# Patient Record
Sex: Female | Born: 1950 | Race: White | Hispanic: No | Marital: Married | Smoking: Former smoker
Health system: Southern US, Community
[De-identification: ages and names within clinical notes are randomized; demographics above are authoritative.]

## PROBLEM LIST (undated history)

## (undated) DIAGNOSIS — R011 Cardiac murmur, unspecified: Secondary | ICD-10-CM

## (undated) DIAGNOSIS — E669 Obesity, unspecified: Secondary | ICD-10-CM

## (undated) DIAGNOSIS — K219 Gastro-esophageal reflux disease without esophagitis: Secondary | ICD-10-CM

## (undated) DIAGNOSIS — E785 Hyperlipidemia, unspecified: Secondary | ICD-10-CM

## (undated) DIAGNOSIS — D689 Coagulation defect, unspecified: Secondary | ICD-10-CM

## (undated) DIAGNOSIS — F419 Anxiety disorder, unspecified: Secondary | ICD-10-CM

## (undated) DIAGNOSIS — T7840XA Allergy, unspecified, initial encounter: Secondary | ICD-10-CM

## (undated) DIAGNOSIS — M1990 Unspecified osteoarthritis, unspecified site: Secondary | ICD-10-CM

## (undated) DIAGNOSIS — I10 Essential (primary) hypertension: Secondary | ICD-10-CM

## (undated) DIAGNOSIS — E871 Hypo-osmolality and hyponatremia: Secondary | ICD-10-CM

## (undated) DIAGNOSIS — R112 Nausea with vomiting, unspecified: Secondary | ICD-10-CM

## (undated) DIAGNOSIS — R7303 Prediabetes: Secondary | ICD-10-CM

## (undated) HISTORY — PX: COLONOSCOPY: SHX174

## (undated) HISTORY — DX: Obesity, unspecified: E66.9

## (undated) HISTORY — DX: Coagulation defect, unspecified: D68.9

## (undated) HISTORY — PX: CERVIX LESION DESTRUCTION: SHX591

## (undated) HISTORY — DX: Anxiety disorder, unspecified: F41.9

## (undated) HISTORY — DX: Gastro-esophageal reflux disease without esophagitis: K21.9

## (undated) HISTORY — DX: Allergy, unspecified, initial encounter: T78.40XA

## (undated) HISTORY — DX: Prediabetes: R73.03

## (undated) HISTORY — DX: Unspecified osteoarthritis, unspecified site: M19.90

## (undated) HISTORY — DX: Cardiac murmur, unspecified: R01.1

## (undated) HISTORY — DX: Hyperlipidemia, unspecified: E78.5

## (undated) HISTORY — DX: Essential (primary) hypertension: I10

---
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
## 1998-06-10 ENCOUNTER — Other Ambulatory Visit: Admission: RE | Admit: 1998-06-10 | Discharge: 1998-06-10 | Payer: Self-pay | Admitting: Obstetrics and Gynecology

## 2006-03-19 ENCOUNTER — Ambulatory Visit: Payer: Self-pay | Admitting: Internal Medicine

## 2006-04-02 ENCOUNTER — Encounter (INDEPENDENT_AMBULATORY_CARE_PROVIDER_SITE_OTHER): Payer: Self-pay | Admitting: *Deleted

## 2006-04-02 ENCOUNTER — Ambulatory Visit: Payer: Self-pay | Admitting: Internal Medicine

## 2009-09-13 ENCOUNTER — Ambulatory Visit (HOSPITAL_BASED_OUTPATIENT_CLINIC_OR_DEPARTMENT_OTHER): Admission: RE | Admit: 2009-09-13 | Discharge: 2009-09-13 | Payer: Self-pay | Admitting: Orthopedic Surgery

## 2009-11-13 HISTORY — PX: THUMB ARTHROSCOPY: SHX2509

## 2010-10-03 ENCOUNTER — Ambulatory Visit: Payer: Self-pay | Admitting: Cardiology

## 2011-01-19 ENCOUNTER — Encounter: Payer: Self-pay | Admitting: *Deleted

## 2011-01-19 ENCOUNTER — Other Ambulatory Visit: Payer: Self-pay | Admitting: *Deleted

## 2011-01-19 DIAGNOSIS — I10 Essential (primary) hypertension: Secondary | ICD-10-CM

## 2011-01-19 MED ORDER — EZETIMIBE 10 MG PO TABS: 10.0000 mg | ORAL_TABLET | Freq: Every day | ORAL | Status: DC

## 2011-01-19 MED ORDER — ATENOLOL 25 MG PO TABS: 25.0000 mg | ORAL_TABLET | Freq: Every day | ORAL | Status: DC

## 2011-03-23 LAB — BASIC METABOLIC PANEL
BUN: 16 mg/dL (ref 6–23)
GFR calc Af Amer: >60 mL/min (ref >60)
GFR calc non Af Amer: >60 mL/min (ref >60)
Potassium: 3.3 mEq/L — ABNORMAL LOW (ref 3.5–5.1)
Sodium: 139 mEq/L (ref 135–145)

## 2011-03-23 LAB — POCT HEMOGLOBIN-HEMACUE: Hemoglobin: 12.9 g/dL (ref 12.0–15.0)

## 2011-04-19 ENCOUNTER — Ambulatory Visit: Payer: Self-pay | Admitting: Cardiology

## 2011-04-23 ENCOUNTER — Encounter: Payer: Self-pay | Admitting: *Deleted

## 2011-04-23 ENCOUNTER — Ambulatory Visit (INDEPENDENT_AMBULATORY_CARE_PROVIDER_SITE_OTHER): Payer: Federal, State, Local not specified - PPO | Admitting: Cardiology

## 2011-04-23 ENCOUNTER — Encounter: Payer: Self-pay | Admitting: Cardiology

## 2011-04-23 VITALS — BP 144/82 | HR 62 | Ht 62.0 in | Wt 145.0 lb

## 2011-04-23 DIAGNOSIS — I10 Essential (primary) hypertension: Secondary | ICD-10-CM | POA: Insufficient documentation

## 2011-04-23 DIAGNOSIS — F419 Anxiety disorder, unspecified: Secondary | ICD-10-CM | POA: Insufficient documentation

## 2011-04-23 DIAGNOSIS — R9431 Abnormal electrocardiogram [ECG] [EKG]: Secondary | ICD-10-CM

## 2011-04-23 DIAGNOSIS — E785 Hyperlipidemia, unspecified: Secondary | ICD-10-CM | POA: Insufficient documentation

## 2011-04-23 DIAGNOSIS — R7303 Prediabetes: Secondary | ICD-10-CM | POA: Insufficient documentation

## 2011-04-23 DIAGNOSIS — E669 Obesity, unspecified: Secondary | ICD-10-CM | POA: Insufficient documentation

## 2011-04-23 NOTE — Assessment & Plan Note (Signed)
A positive family history and cardiovascular risk factors, we will arrange for her to have a stress Cardiolite study. I will have her see Dr. Felipa Eth for her followup medical care.

## 2011-04-23 NOTE — Assessment & Plan Note (Signed)
Blood pressure readings at home have been satisfactory

## 2011-04-23 NOTE — Assessment & Plan Note (Signed)
She's been doing a good job a gradually losing weight I encouraged her to continue exercise and dietary discipline.

## 2011-04-23 NOTE — Progress Notes (Signed)
Subjective:   Kimberly Huynh is seen back today for followup visit. Overall, she's been going reasonably well but she does have and then persistent abnormal EKG with abnormal T wave changes as well as a strong positive family history of heart disease. Her last stress Cardiolite study was in 2006 she's been exercising reasonably regularly but hasn't lost a great deal of weight. She has chronic problems of hypertension, obesity, hyperlipemia, and mild glucose intolerance. She sees Dr. Felipa Eth for followup.  Current Outpatient Prescriptions  Medication Sig Dispense Refill  . ALPRAZolam (XANAX) 0.5 MG tablet Take 0.25 mg by mouth at bedtime as needed.        . Ascorbic Acid (VITAMIN C) 500 MG tablet Take 500 mg by mouth daily.        Marland Kitchen aspirin 81 MG tablet Take 81 mg by mouth daily.        Marland Kitchen atenolol (TENORMIN) 25 MG tablet Take 1 tablet (25 mg total) by mouth daily.  30 tablet  11  . Calcium Carbonate-Vitamin D (CALCIUM + D PO) Take 2 tablets by mouth 2 (two) times daily.        . Choline Fenofibrate 135 MG capsule Take 135 mg by mouth daily.        . Cinnamon 500 MG capsule Take 1,000 mg by mouth 2 (two) times daily.        . ergocalciferol (VITAMIN D2) 50000 UNITS capsule Take 3,000 Units by mouth daily.        Marland Kitchen ezetimibe (ZETIA) 10 MG tablet Take 1 tablet (10 mg total) by mouth daily.  30 tablet  11  . levocetirizine (XYZAL) 5 MG tablet Take 5 mg by mouth every evening.        . multivitamin (THERAGRAN) per tablet Take 1 tablet by mouth daily.        Marland Kitchen omega-3 acid ethyl esters (LOVAZA) 1 G capsule Take 2 g by mouth daily.        . rosuvastatin (CRESTOR) 5 MG tablet Take 5 mg by mouth daily.        . Timolol Hemihydrate (BETIMOL OP) Apply 1 drop to eye daily. LEFT EYE      . valsartan-hydrochlorothiazide (DIOVAN-HCT) 80-12.5 MG per tablet Take 1 tablet by mouth daily.          No Known Allergies  Patient Active Problem List  Diagnoses  . Hypertension  . Hyperlipidemia  . Anxiety  . Obesity  .  Glucose intolerance (pre-diabetes)  . Abnormal EKG    History  Smoking status  . Former Smoker -- 0.8 packs/day for 10 years  . Types: Cigarettes  . Quit date: 12/18/1995  Smokeless tobacco  . Never Used    History  Alcohol Use  . Yes    Occ    Family History  Problem Relation Age of Onset  . Stroke Father   . Hypertension Brother   . Hypertension Brother   . Hypertension Brother   . Hypertension Sister   . Diabetes Mother   . Diabetes Brother     Review of Systems:   The patient denies any heat or cold intolerance.  No weight gain or weight loss.  The patient denies headaches or blurry vision.  There is no cough or sputum production.  The patient denies dizziness.  There is no hematuria or hematochezia.  The patient denies any muscle aches or arthritis.  The patient denies any rash.  The patient denies frequent falling or instability.  There is no  history of depression or anxiety.  All other systems were reviewed and are negative.   Physical Exam:   Weight is 145. Blood pressure 140/80 sitting, 140/82 standing, heart rate 62The head is normocephalic and atraumatic.  Pupils are equally round and reactive to light.  Sclerae nonicteric.  Conjunctiva is clear.  Oropharynx is unremarkable.  There's adequate oral airway.  Neck is supple there are no masses.  Thyroid is not enlarged.  There is no lymphadenopathy.  Lungs are clear.  Chest is symmetric.  Heart shows a regular rate and rhythm.  S1 and S2 are normal.  There is no murmur click or gallop.  Abdomen is soft normal bowel sounds.  There is no organomegaly.  Genital and rectal deferred.  Extremities are without edema.  Peripheral pulses are adequate.  Neurologically intact.  Full range of motion.  The patient is not depressed.  Skin is warm and dry. Assessment / Plan:

## 2011-05-02 ENCOUNTER — Ambulatory Visit (HOSPITAL_COMMUNITY): Payer: Federal, State, Local not specified - PPO | Attending: Cardiology | Admitting: Radiology

## 2011-05-02 VITALS — Ht 62.0 in | Wt 151.0 lb

## 2011-05-02 DIAGNOSIS — R9431 Abnormal electrocardiogram [ECG] [EKG]: Secondary | ICD-10-CM | POA: Insufficient documentation

## 2011-05-02 MED ORDER — TECHNETIUM TC 99M TETROFOSMIN IV KIT
33.0000 | PACK | Freq: Once | INTRAVENOUS | Status: AC | PRN
  Administered 2011-05-02: 33 via INTRAVENOUS

## 2011-05-02 MED ORDER — TECHNETIUM TC 99M TETROFOSMIN IV KIT
11.0000 | PACK | Freq: Once | INTRAVENOUS | Status: AC | PRN
  Administered 2011-05-02: 11 via INTRAVENOUS

## 2011-05-02 NOTE — Progress Notes (Signed)
[facility] 3 NUCLEAR MED [address] [place] [address] [phone]  Cardiology Nuclear Med Study  Kimberly Huynh is a 60 y.o. female [id number] [birth year]   Nuclear Med Background Indication for Stress Test:  Evaluation for Ischemia and Abnormal EKG History:  '06 OZH:YQMVHQ, EF=81% Cardiac Risk Factors:Strong Family History - CAD, History of Smoking, Hypertension and Lipids  Symptoms:  No cardiac complaints.   Nuclear Pre-Procedure Caffeine/Decaff Intake:  None NPO After: 10:30pm   Lungs:  Clear. IV 0.9% NS with Angio Cath:  20g  IV Site: R Wrist  IV Started by:  Stanton Kidney, EMT-P  Chest Size (in):  36 Cup Size: DD  Height: 5\' 2"  (1.575 m)  Weight:  151 lb (68.493 kg)  BMI:  Body mass index is 27.62 kg/(m^2). Tech Comments:  Atenolol held > 36 hours, per patient    Nuclear Med Study 1 or 2 day study: 1 day  Stress Test Type:  Stress  Reading MD: Arvilla Meres, MD  Order Authorizing Provider:  Roger Shelter, MD  Resting Radionuclide: Technetium 65m Tetrofosmin  Resting Radionuclide Dose: 11.0 mCi   Stress Radionuclide:  Technetium 10m Tetrofosmin  Stress Radionuclide Dose: 33.0 mCi           Stress Protocol Rest HR: 62 Stress HR: 144  Rest BP: 143/94 Stress BP: 194/91  Exercise Time (min): 8:00 METS: 10.0   Predicted Max HR: 158 bpm % Max HR: 91.14 bpm Rate Pressure Product: 46962   Dose of Adenosine (mg):  n/a Dose of Lexiscan: n/a mg  Dose of Atropine (mg): n/a Dose of Dobutamine: n/a mcg/kg/min (at max HR)  Stress Test Technologist: Smiley Houseman, CMA-N  Nuclear Technologist:  Domenic Polite, CNMT     Rest Procedure:  Myocardial perfusion imaging was performed at rest 45 minutes following the intravenous administration of Technetium 21m Tetrofosmin.  Rest ECG: Nonspecific T-wave changes.  Stress Procedure:  The patient exercised for eight minutes on the treadmill utilizing the Bruce protocol.  The patient stopped due to  fatigue and denied any chest pain.  There were marked ST-T wave changes, as noted on previous Cardiolite.  Technetium 69m Tetrofosmin was injected at peak exercise and myocardial perfusion imaging was performed after a brief delay.  Stress ECG: Significant ST abnormalities consistent with ischemia. and Positive. 1-2 mm inferior ST depression with exercise.   QPS Raw Data Images:  Normal; no motion artifact; normal heart/lung ratio. Stress Images:  Normal homogeneous uptake in all areas of the myocardium. Rest Images:  Normal homogeneous uptake in all areas of the myocardium. Subtraction (SDS):  Normal Transient Ischemic Dilatation (Normal <1.22):  0.84 Lung/Heart Ratio (Normal <0.45):  0.30  Quantitative Gated Spect Images QGS EDV:  43 ml QGS ESV:  6 ml QGS cine images:  NL LV Function; NL Wall Motion QGS EF: 87%  Impression Exercise Capacity:  Fair exercise capacity. BP Response:  Normal blood pressure response. Clinical Symptoms:  No chest pain. ECG Impression:  Positive. 1-2 mm inferior ST depression with exercise. Comparison with Prior Nuclear Study: No images to compare  Overall Impression:  Normal stress nuclear study. Exercise ECG likely false positive.    Kimberly Huynh

## 2011-05-03 ENCOUNTER — Other Ambulatory Visit: Payer: Self-pay | Admitting: *Deleted

## 2011-05-03 DIAGNOSIS — I10 Essential (primary) hypertension: Secondary | ICD-10-CM

## 2011-05-03 MED ORDER — VALSARTAN-HYDROCHLOROTHIAZIDE 80-12.5 MG PO TABS: 1.0000 | ORAL_TABLET | Freq: Every day | ORAL | Status: DC

## 2011-05-03 NOTE — Progress Notes (Signed)
Copy routed to Dr. Tennant.Falecha Clark ° °

## 2011-05-09 ENCOUNTER — Telehealth: Payer: Self-pay | Admitting: Cardiology

## 2011-05-09 NOTE — Telephone Encounter (Signed)
Results of stress test reviewed by Dr Deborah Chalk. Ok to report results are normal. Pt aware that she will be seen by Dietrich Pates at [facility] cardiology as needed. She also asked that we please fax results of stress test to Dr. Larina Earthly. [facility] primary doc

## 2011-05-09 NOTE — Telephone Encounter (Signed)
TESTING RESULTS-?STRESS TEST

## 2011-05-10 ENCOUNTER — Encounter: Payer: Self-pay | Admitting: Internal Medicine

## 2011-05-29 ENCOUNTER — Telehealth: Payer: Self-pay | Admitting: Cardiology

## 2011-05-29 NOTE — Telephone Encounter (Signed)
Pt requesting last nuclear testing, office visit, and labs to be faxed to Dr. Felipa Eth.  RN faxed requested records to Dr. Felipa Eth.  Pt knows to f/u with Dr. Dietrich Pates PRN.

## 2011-05-29 NOTE — Telephone Encounter (Signed)
Wants to talk to RN regarding who she will be seeing after Dr.Tennant leaves.

## 2011-06-27 ENCOUNTER — Ambulatory Visit (AMBULATORY_SURGERY_CENTER): Payer: Federal, State, Local not specified - PPO | Admitting: *Deleted

## 2011-06-27 VITALS — Ht 62.0 in | Wt 152.0 lb

## 2011-06-27 DIAGNOSIS — Z86010 Personal history of colonic polyps: Secondary | ICD-10-CM

## 2011-06-27 MED ORDER — PEG-KCL-NACL-NASULF-NA ASC-C 100 G PO SOLR: ORAL | Status: DC

## 2011-07-11 ENCOUNTER — Encounter: Payer: Self-pay | Admitting: Internal Medicine

## 2011-07-11 ENCOUNTER — Ambulatory Visit (AMBULATORY_SURGERY_CENTER): Payer: Federal, State, Local not specified - PPO | Admitting: Internal Medicine

## 2011-07-11 VITALS — BP 138/76 | HR 67 | Temp 98.5°F | Resp 14 | Ht 62.0 in | Wt 150.0 lb

## 2011-07-11 DIAGNOSIS — D126 Benign neoplasm of colon, unspecified: Secondary | ICD-10-CM

## 2011-07-11 DIAGNOSIS — Z1211 Encounter for screening for malignant neoplasm of colon: Secondary | ICD-10-CM

## 2011-07-11 DIAGNOSIS — Z86010 Personal history of colonic polyps: Secondary | ICD-10-CM

## 2011-07-11 MED ORDER — SODIUM CHLORIDE 0.9 % IV SOLN: 500.0000 mL | INTRAVENOUS | Status: DC

## 2011-07-11 NOTE — Patient Instructions (Signed)
Follow the discharge instructions.  Resume your medications.  Next Colonoscopy in 5 years.

## 2011-07-12 ENCOUNTER — Telehealth: Payer: Self-pay | Admitting: *Deleted

## 2011-07-12 NOTE — Telephone Encounter (Signed)
Left message on work voice mail that if pt had questions call us back. E McCraw RN

## 2011-08-10 ENCOUNTER — Other Ambulatory Visit: Payer: Self-pay | Admitting: *Deleted

## 2011-08-10 MED ORDER — CHOLINE FENOFIBRATE 135 MG PO CPDR: 135.0000 mg | DELAYED_RELEASE_CAPSULE | Freq: Every day | ORAL | Status: AC

## 2011-08-10 NOTE — Telephone Encounter (Signed)
escribe medication per fax request  

## 2012-02-29 ENCOUNTER — Other Ambulatory Visit: Payer: Self-pay

## 2012-02-29 DIAGNOSIS — I10 Essential (primary) hypertension: Secondary | ICD-10-CM

## 2012-02-29 MED ORDER — VALSARTAN-HYDROCHLOROTHIAZIDE 80-12.5 MG PO TABS: 1.0000 | ORAL_TABLET | Freq: Every day | ORAL | Status: DC

## 2012-12-24 ENCOUNTER — Other Ambulatory Visit: Payer: Self-pay | Admitting: *Deleted

## 2012-12-24 DIAGNOSIS — I10 Essential (primary) hypertension: Secondary | ICD-10-CM

## 2012-12-24 MED ORDER — VALSARTAN-HYDROCHLOROTHIAZIDE 80-12.5 MG PO TABS: 1.0000 | ORAL_TABLET | Freq: Every day | ORAL | Status: DC

## 2013-08-10 ENCOUNTER — Other Ambulatory Visit: Payer: Self-pay | Admitting: Dermatology

## 2014-05-03 DIAGNOSIS — E1169 Type 2 diabetes mellitus with other specified complication: Secondary | ICD-10-CM | POA: Diagnosis not present

## 2014-05-03 DIAGNOSIS — E782 Mixed hyperlipidemia: Secondary | ICD-10-CM | POA: Diagnosis not present

## 2014-05-04 DIAGNOSIS — I10 Essential (primary) hypertension: Secondary | ICD-10-CM | POA: Diagnosis not present

## 2014-05-04 DIAGNOSIS — E1169 Type 2 diabetes mellitus with other specified complication: Secondary | ICD-10-CM | POA: Diagnosis not present

## 2014-05-04 DIAGNOSIS — E782 Mixed hyperlipidemia: Secondary | ICD-10-CM | POA: Diagnosis not present

## 2014-05-04 DIAGNOSIS — Z6826 Body mass index (BMI) 26.0-26.9, adult: Secondary | ICD-10-CM | POA: Diagnosis not present

## 2014-05-04 DIAGNOSIS — J301 Allergic rhinitis due to pollen: Secondary | ICD-10-CM | POA: Diagnosis not present

## 2014-05-12 DIAGNOSIS — H409 Unspecified glaucoma: Secondary | ICD-10-CM | POA: Diagnosis not present

## 2014-05-12 DIAGNOSIS — H2589 Other age-related cataract: Secondary | ICD-10-CM | POA: Diagnosis not present

## 2014-05-12 DIAGNOSIS — H4011X0 Primary open-angle glaucoma, stage unspecified: Secondary | ICD-10-CM | POA: Diagnosis not present

## 2014-06-22 DIAGNOSIS — Z09 Encounter for follow-up examination after completed treatment for conditions other than malignant neoplasm: Secondary | ICD-10-CM | POA: Diagnosis not present

## 2014-08-17 DIAGNOSIS — L821 Other seborrheic keratosis: Secondary | ICD-10-CM | POA: Diagnosis not present

## 2014-08-17 DIAGNOSIS — L570 Actinic keratosis: Secondary | ICD-10-CM | POA: Diagnosis not present

## 2014-08-17 DIAGNOSIS — Z411 Encounter for cosmetic surgery: Secondary | ICD-10-CM | POA: Diagnosis not present

## 2014-08-17 DIAGNOSIS — D239 Other benign neoplasm of skin, unspecified: Secondary | ICD-10-CM | POA: Diagnosis not present

## 2014-09-07 DIAGNOSIS — Z124 Encounter for screening for malignant neoplasm of cervix: Secondary | ICD-10-CM | POA: Diagnosis not present

## 2014-09-07 DIAGNOSIS — Z01419 Encounter for gynecological examination (general) (routine) without abnormal findings: Secondary | ICD-10-CM | POA: Diagnosis not present

## 2014-09-21 ENCOUNTER — Encounter: Payer: Self-pay | Admitting: Internal Medicine

## 2014-09-22 DIAGNOSIS — E782 Mixed hyperlipidemia: Secondary | ICD-10-CM | POA: Diagnosis not present

## 2014-09-23 DIAGNOSIS — I10 Essential (primary) hypertension: Secondary | ICD-10-CM | POA: Diagnosis not present

## 2014-09-23 DIAGNOSIS — E782 Mixed hyperlipidemia: Secondary | ICD-10-CM | POA: Diagnosis not present

## 2014-09-23 DIAGNOSIS — Z23 Encounter for immunization: Secondary | ICD-10-CM | POA: Diagnosis not present

## 2014-09-23 DIAGNOSIS — T148 Other injury of unspecified body region: Secondary | ICD-10-CM | POA: Diagnosis not present

## 2014-09-23 DIAGNOSIS — E119 Type 2 diabetes mellitus without complications: Secondary | ICD-10-CM | POA: Diagnosis not present

## 2014-10-13 DIAGNOSIS — H4011X1 Primary open-angle glaucoma, mild stage: Secondary | ICD-10-CM | POA: Diagnosis not present

## 2014-12-15 DIAGNOSIS — Z1231 Encounter for screening mammogram for malignant neoplasm of breast: Secondary | ICD-10-CM | POA: Diagnosis not present

## 2014-12-15 DIAGNOSIS — Z803 Family history of malignant neoplasm of breast: Secondary | ICD-10-CM | POA: Diagnosis not present

## 2015-01-19 DIAGNOSIS — I10 Essential (primary) hypertension: Secondary | ICD-10-CM | POA: Diagnosis not present

## 2015-01-19 DIAGNOSIS — E782 Mixed hyperlipidemia: Secondary | ICD-10-CM | POA: Diagnosis not present

## 2015-01-19 DIAGNOSIS — E119 Type 2 diabetes mellitus without complications: Secondary | ICD-10-CM | POA: Diagnosis not present

## 2015-01-20 DIAGNOSIS — E119 Type 2 diabetes mellitus without complications: Secondary | ICD-10-CM | POA: Diagnosis not present

## 2015-01-20 DIAGNOSIS — Z6827 Body mass index (BMI) 27.0-27.9, adult: Secondary | ICD-10-CM | POA: Diagnosis not present

## 2015-01-20 DIAGNOSIS — E782 Mixed hyperlipidemia: Secondary | ICD-10-CM | POA: Diagnosis not present

## 2015-01-20 DIAGNOSIS — I10 Essential (primary) hypertension: Secondary | ICD-10-CM | POA: Diagnosis not present

## 2015-01-20 DIAGNOSIS — Z1389 Encounter for screening for other disorder: Secondary | ICD-10-CM | POA: Diagnosis not present

## 2015-01-20 DIAGNOSIS — M546 Pain in thoracic spine: Secondary | ICD-10-CM | POA: Diagnosis not present

## 2015-04-08 DIAGNOSIS — H4011X1 Primary open-angle glaucoma, mild stage: Secondary | ICD-10-CM | POA: Diagnosis not present

## 2015-04-25 DIAGNOSIS — H4011X1 Primary open-angle glaucoma, mild stage: Secondary | ICD-10-CM | POA: Diagnosis not present

## 2015-05-03 DIAGNOSIS — I10 Essential (primary) hypertension: Secondary | ICD-10-CM | POA: Diagnosis not present

## 2015-05-03 DIAGNOSIS — E559 Vitamin D deficiency, unspecified: Secondary | ICD-10-CM | POA: Diagnosis not present

## 2015-05-03 DIAGNOSIS — E782 Mixed hyperlipidemia: Secondary | ICD-10-CM | POA: Diagnosis not present

## 2015-05-03 DIAGNOSIS — E119 Type 2 diabetes mellitus without complications: Secondary | ICD-10-CM | POA: Diagnosis not present

## 2015-05-12 DIAGNOSIS — M810 Age-related osteoporosis without current pathological fracture: Secondary | ICD-10-CM | POA: Diagnosis not present

## 2015-05-12 DIAGNOSIS — Z6827 Body mass index (BMI) 27.0-27.9, adult: Secondary | ICD-10-CM | POA: Diagnosis not present

## 2015-05-12 DIAGNOSIS — J302 Other seasonal allergic rhinitis: Secondary | ICD-10-CM | POA: Diagnosis not present

## 2015-05-12 DIAGNOSIS — E119 Type 2 diabetes mellitus without complications: Secondary | ICD-10-CM | POA: Diagnosis not present

## 2015-05-12 DIAGNOSIS — E782 Mixed hyperlipidemia: Secondary | ICD-10-CM | POA: Diagnosis not present

## 2015-05-12 DIAGNOSIS — E559 Vitamin D deficiency, unspecified: Secondary | ICD-10-CM | POA: Diagnosis not present

## 2015-05-12 DIAGNOSIS — Z23 Encounter for immunization: Secondary | ICD-10-CM | POA: Diagnosis not present

## 2015-05-12 DIAGNOSIS — M546 Pain in thoracic spine: Secondary | ICD-10-CM | POA: Diagnosis not present

## 2015-05-12 DIAGNOSIS — I10 Essential (primary) hypertension: Secondary | ICD-10-CM | POA: Diagnosis not present

## 2015-05-12 DIAGNOSIS — G4700 Insomnia, unspecified: Secondary | ICD-10-CM | POA: Diagnosis not present

## 2015-05-12 DIAGNOSIS — Z0000 Encounter for general adult medical examination without abnormal findings: Secondary | ICD-10-CM | POA: Diagnosis not present

## 2015-05-17 DIAGNOSIS — Z1212 Encounter for screening for malignant neoplasm of rectum: Secondary | ICD-10-CM | POA: Diagnosis not present

## 2015-09-08 DIAGNOSIS — D225 Melanocytic nevi of trunk: Secondary | ICD-10-CM | POA: Diagnosis not present

## 2015-09-08 DIAGNOSIS — Z86018 Personal history of other benign neoplasm: Secondary | ICD-10-CM | POA: Diagnosis not present

## 2015-09-08 DIAGNOSIS — L821 Other seborrheic keratosis: Secondary | ICD-10-CM | POA: Diagnosis not present

## 2015-09-08 DIAGNOSIS — Z872 Personal history of diseases of the skin and subcutaneous tissue: Secondary | ICD-10-CM | POA: Diagnosis not present

## 2015-09-23 DIAGNOSIS — Z6827 Body mass index (BMI) 27.0-27.9, adult: Secondary | ICD-10-CM | POA: Diagnosis not present

## 2015-09-23 DIAGNOSIS — E782 Mixed hyperlipidemia: Secondary | ICD-10-CM | POA: Diagnosis not present

## 2015-09-23 DIAGNOSIS — E119 Type 2 diabetes mellitus without complications: Secondary | ICD-10-CM | POA: Diagnosis not present

## 2015-09-23 DIAGNOSIS — Z23 Encounter for immunization: Secondary | ICD-10-CM | POA: Diagnosis not present

## 2015-09-23 DIAGNOSIS — I10 Essential (primary) hypertension: Secondary | ICD-10-CM | POA: Diagnosis not present

## 2015-09-23 DIAGNOSIS — G4700 Insomnia, unspecified: Secondary | ICD-10-CM | POA: Diagnosis not present

## 2015-10-27 DIAGNOSIS — Z6827 Body mass index (BMI) 27.0-27.9, adult: Secondary | ICD-10-CM | POA: Diagnosis not present

## 2015-10-27 DIAGNOSIS — I10 Essential (primary) hypertension: Secondary | ICD-10-CM | POA: Diagnosis not present

## 2015-10-27 DIAGNOSIS — E119 Type 2 diabetes mellitus without complications: Secondary | ICD-10-CM | POA: Diagnosis not present

## 2015-10-27 DIAGNOSIS — R5383 Other fatigue: Secondary | ICD-10-CM | POA: Diagnosis not present

## 2015-10-27 DIAGNOSIS — E559 Vitamin D deficiency, unspecified: Secondary | ICD-10-CM | POA: Diagnosis not present

## 2015-10-27 DIAGNOSIS — F419 Anxiety disorder, unspecified: Secondary | ICD-10-CM | POA: Diagnosis not present

## 2015-11-28 DIAGNOSIS — H401131 Primary open-angle glaucoma, bilateral, mild stage: Secondary | ICD-10-CM | POA: Diagnosis not present

## 2015-12-02 DIAGNOSIS — Z124 Encounter for screening for malignant neoplasm of cervix: Secondary | ICD-10-CM | POA: Diagnosis not present

## 2015-12-02 DIAGNOSIS — N907 Vulvar cyst: Secondary | ICD-10-CM | POA: Diagnosis not present

## 2015-12-20 DIAGNOSIS — Z1231 Encounter for screening mammogram for malignant neoplasm of breast: Secondary | ICD-10-CM | POA: Diagnosis not present

## 2015-12-20 DIAGNOSIS — Z803 Family history of malignant neoplasm of breast: Secondary | ICD-10-CM | POA: Diagnosis not present

## 2015-12-30 DIAGNOSIS — Z1389 Encounter for screening for other disorder: Secondary | ICD-10-CM | POA: Diagnosis not present

## 2015-12-30 DIAGNOSIS — I10 Essential (primary) hypertension: Secondary | ICD-10-CM | POA: Diagnosis not present

## 2015-12-30 DIAGNOSIS — E559 Vitamin D deficiency, unspecified: Secondary | ICD-10-CM | POA: Diagnosis not present

## 2015-12-30 DIAGNOSIS — Z6827 Body mass index (BMI) 27.0-27.9, adult: Secondary | ICD-10-CM | POA: Diagnosis not present

## 2015-12-30 DIAGNOSIS — M810 Age-related osteoporosis without current pathological fracture: Secondary | ICD-10-CM | POA: Diagnosis not present

## 2015-12-30 DIAGNOSIS — E782 Mixed hyperlipidemia: Secondary | ICD-10-CM | POA: Diagnosis not present

## 2015-12-30 DIAGNOSIS — E119 Type 2 diabetes mellitus without complications: Secondary | ICD-10-CM | POA: Diagnosis not present

## 2016-01-05 DIAGNOSIS — H2513 Age-related nuclear cataract, bilateral: Secondary | ICD-10-CM | POA: Diagnosis not present

## 2016-01-05 DIAGNOSIS — H401131 Primary open-angle glaucoma, bilateral, mild stage: Secondary | ICD-10-CM | POA: Diagnosis not present

## 2016-02-13 ENCOUNTER — Other Ambulatory Visit: Payer: Self-pay | Admitting: Gastroenterology

## 2016-05-18 DIAGNOSIS — H401131 Primary open-angle glaucoma, bilateral, mild stage: Secondary | ICD-10-CM | POA: Diagnosis not present

## 2016-06-04 ENCOUNTER — Encounter: Payer: Self-pay | Admitting: Internal Medicine

## 2016-06-08 DIAGNOSIS — E782 Mixed hyperlipidemia: Secondary | ICD-10-CM | POA: Diagnosis not present

## 2016-06-08 DIAGNOSIS — R8299 Other abnormal findings in urine: Secondary | ICD-10-CM | POA: Diagnosis not present

## 2016-06-08 DIAGNOSIS — I10 Essential (primary) hypertension: Secondary | ICD-10-CM | POA: Diagnosis not present

## 2016-06-08 DIAGNOSIS — E559 Vitamin D deficiency, unspecified: Secondary | ICD-10-CM | POA: Diagnosis not present

## 2016-06-08 DIAGNOSIS — E119 Type 2 diabetes mellitus without complications: Secondary | ICD-10-CM | POA: Diagnosis not present

## 2016-06-08 DIAGNOSIS — N390 Urinary tract infection, site not specified: Secondary | ICD-10-CM | POA: Diagnosis not present

## 2016-06-15 DIAGNOSIS — J302 Other seasonal allergic rhinitis: Secondary | ICD-10-CM | POA: Diagnosis not present

## 2016-06-15 DIAGNOSIS — I10 Essential (primary) hypertension: Secondary | ICD-10-CM | POA: Diagnosis not present

## 2016-06-15 DIAGNOSIS — Z1389 Encounter for screening for other disorder: Secondary | ICD-10-CM | POA: Diagnosis not present

## 2016-06-15 DIAGNOSIS — E782 Mixed hyperlipidemia: Secondary | ICD-10-CM | POA: Diagnosis not present

## 2016-06-15 DIAGNOSIS — Z0000 Encounter for general adult medical examination without abnormal findings: Secondary | ICD-10-CM | POA: Diagnosis not present

## 2016-06-15 DIAGNOSIS — R011 Cardiac murmur, unspecified: Secondary | ICD-10-CM | POA: Diagnosis not present

## 2016-06-15 DIAGNOSIS — E119 Type 2 diabetes mellitus without complications: Secondary | ICD-10-CM | POA: Diagnosis not present

## 2016-06-15 DIAGNOSIS — F419 Anxiety disorder, unspecified: Secondary | ICD-10-CM | POA: Diagnosis not present

## 2016-06-15 DIAGNOSIS — Z6826 Body mass index (BMI) 26.0-26.9, adult: Secondary | ICD-10-CM | POA: Diagnosis not present

## 2016-06-15 DIAGNOSIS — M810 Age-related osteoporosis without current pathological fracture: Secondary | ICD-10-CM | POA: Diagnosis not present

## 2016-06-20 ENCOUNTER — Other Ambulatory Visit (HOSPITAL_COMMUNITY): Payer: Self-pay | Admitting: Internal Medicine

## 2016-06-20 DIAGNOSIS — R011 Cardiac murmur, unspecified: Secondary | ICD-10-CM

## 2016-06-27 DIAGNOSIS — Z872 Personal history of diseases of the skin and subcutaneous tissue: Secondary | ICD-10-CM | POA: Diagnosis not present

## 2016-06-27 DIAGNOSIS — D485 Neoplasm of uncertain behavior of skin: Secondary | ICD-10-CM | POA: Diagnosis not present

## 2016-06-27 DIAGNOSIS — L570 Actinic keratosis: Secondary | ICD-10-CM | POA: Diagnosis not present

## 2016-06-27 DIAGNOSIS — D225 Melanocytic nevi of trunk: Secondary | ICD-10-CM | POA: Diagnosis not present

## 2016-06-27 DIAGNOSIS — Z86018 Personal history of other benign neoplasm: Secondary | ICD-10-CM | POA: Diagnosis not present

## 2016-06-27 DIAGNOSIS — L821 Other seborrheic keratosis: Secondary | ICD-10-CM | POA: Diagnosis not present

## 2016-07-02 DIAGNOSIS — H401131 Primary open-angle glaucoma, bilateral, mild stage: Secondary | ICD-10-CM | POA: Diagnosis not present

## 2016-07-05 ENCOUNTER — Other Ambulatory Visit (HOSPITAL_COMMUNITY): Payer: Federal, State, Local not specified - PPO

## 2016-07-17 ENCOUNTER — Ambulatory Visit (HOSPITAL_COMMUNITY): Payer: Medicare Other | Attending: Cardiovascular Disease

## 2016-07-17 ENCOUNTER — Other Ambulatory Visit (HOSPITAL_COMMUNITY): Payer: Self-pay

## 2016-07-17 DIAGNOSIS — I119 Hypertensive heart disease without heart failure: Secondary | ICD-10-CM | POA: Diagnosis not present

## 2016-07-17 DIAGNOSIS — I071 Rheumatic tricuspid insufficiency: Secondary | ICD-10-CM | POA: Diagnosis not present

## 2016-07-17 DIAGNOSIS — Z87891 Personal history of nicotine dependence: Secondary | ICD-10-CM | POA: Insufficient documentation

## 2016-07-17 DIAGNOSIS — E785 Hyperlipidemia, unspecified: Secondary | ICD-10-CM | POA: Insufficient documentation

## 2016-07-17 DIAGNOSIS — R9431 Abnormal electrocardiogram [ECG] [EKG]: Secondary | ICD-10-CM | POA: Diagnosis not present

## 2016-07-17 DIAGNOSIS — R011 Cardiac murmur, unspecified: Secondary | ICD-10-CM | POA: Insufficient documentation

## 2016-07-17 DIAGNOSIS — I340 Nonrheumatic mitral (valve) insufficiency: Secondary | ICD-10-CM | POA: Diagnosis not present

## 2016-07-18 ENCOUNTER — Encounter: Payer: Self-pay | Admitting: Internal Medicine

## 2016-07-20 ENCOUNTER — Encounter: Payer: Self-pay | Admitting: Internal Medicine

## 2016-09-13 ENCOUNTER — Ambulatory Visit (AMBULATORY_SURGERY_CENTER): Payer: Self-pay | Admitting: *Deleted

## 2016-09-13 VITALS — Ht 62.0 in | Wt 143.4 lb

## 2016-09-13 DIAGNOSIS — Z86010 Personal history of colonic polyps: Secondary | ICD-10-CM

## 2016-09-13 MED ORDER — NA SULFATE-K SULFATE-MG SULF 17.5-3.13-1.6 GM/177ML PO SOLN: ORAL | 0 refills | Status: DC

## 2016-09-13 NOTE — Progress Notes (Signed)
No home oxygen used or diet medications taken  No egg or soy allergy  No anesthesia or intubation problems

## 2016-09-27 ENCOUNTER — Encounter: Payer: Medicare Other | Admitting: Internal Medicine

## 2016-10-12 ENCOUNTER — Encounter: Payer: Self-pay | Admitting: Internal Medicine

## 2016-10-12 ENCOUNTER — Ambulatory Visit (AMBULATORY_SURGERY_CENTER): Payer: Medicare Other | Admitting: Internal Medicine

## 2016-10-12 VITALS — BP 123/71 | HR 56 | Temp 97.1°F | Resp 11 | Ht 62.0 in | Wt 143.0 lb

## 2016-10-12 DIAGNOSIS — Z1211 Encounter for screening for malignant neoplasm of colon: Secondary | ICD-10-CM | POA: Diagnosis not present

## 2016-10-12 DIAGNOSIS — Z86010 Personal history of colonic polyps: Secondary | ICD-10-CM | POA: Diagnosis not present

## 2016-10-12 LAB — GLUCOSE, CAPILLARY
GLUCOSE-CAPILLARY: 160 mg/dL — AB (ref 65–99)
GLUCOSE-CAPILLARY: 141 mg/dL — AB (ref 65–99)

## 2016-10-12 MED ORDER — SODIUM CHLORIDE 0.9 % IV SOLN: 500.0000 mL | INTRAVENOUS | Status: DC

## 2016-10-12 NOTE — Patient Instructions (Addendum)
Impression/Recommendations:  Repeat colonoscopy in 10 years for surveillance.  YOU HAD AN ENDOSCOPIC PROCEDURE TODAY AT [facility]:   Refer to the procedure report that was given to you for any specific questions about what was found during the examination.  If the procedure report does not answer your questions, please call your gastroenterologist to clarify.  If you requested that your care partner not be given the details of your procedure findings, then the procedure report has been included in a sealed envelope for you to review at your convenience later.  YOU SHOULD EXPECT: Some feelings of bloating in the abdomen. Passage of more gas than usual.  Walking can help get rid of the air that was put into your GI tract during the procedure and reduce the bloating. If you had a lower endoscopy (such as a colonoscopy or flexible sigmoidoscopy) you may notice spotting of blood in your stool or on the toilet paper. If you underwent a bowel prep for your procedure, you may not have a normal bowel movement for a few days.  Please Note:  You might notice some irritation and congestion in your nose or some drainage.  This is from the oxygen used during your procedure.  There is no need for concern and it should clear up in a day or so.  SYMPTOMS TO REPORT IMMEDIATELY:   Following lower endoscopy (colonoscopy or flexible sigmoidoscopy):  Excessive amounts of blood in the stool  Significant tenderness or worsening of abdominal pains  Swelling of the abdomen that is new, acute  Fever of 100F or higher    For urgent or emergent issues, a gastroenterologist can be reached at any hour by calling [phone].   DIET:  We do recommend a small meal at first, but then you may proceed to your regular diet.  Drink plenty of fluids but you should avoid alcoholic beverages for 24 hours.  ACTIVITY:  You should plan to take it easy for the rest of today and you should NOT DRIVE or use heavy  machinery until tomorrow (because of the sedation medicines used during the test).    FOLLOW UP: Our staff will call the number listed on your records the next business day following your procedure to check on you and address any questions or concerns that you may have regarding the information given to you following your procedure. If we do not reach you, we will leave a message.  However, if you are feeling well and you are not experiencing any problems, there is no need to return our call.  We will assume that you have returned to your regular daily activities without incident.  If any biopsies were taken you will be contacted by phone or by letter within the next 1-3 weeks.  Please call us at [phone] if you have not heard about the biopsies in 3 weeks.    SIGNATURES/CONFIDENTIALITY: You and/or your care partner have signed paperwork which will be entered into your electronic medical record.  These signatures attest to the fact that that the information above on your After Visit Summary has been reviewed and is understood.  Full responsibility of the confidentiality of this discharge information lies with you and/or your care-partner.

## 2016-10-12 NOTE — Op Note (Signed)
[place] Patient Name: Anniemae Lauff Procedure Date: 10/12/2016 8:00 AM MRN: [id number] Endoscopist: Docia Chuck. Henrene Pastor , MD Age: 65 Referring MD:  Date of Birth: 02-25-1951 Gender: Female Account #: [id number] Procedure:                Colonoscopy Indications:              Surveillance: Personal history of adenomatous                            polyps on last colonoscopy 5 years ago, High risk                            colon cancer surveillance: Personal history of                            non-advanced adenoma. Prior examinations 2007 and                            2012 with single diminutive adenoma Medicines:                Monitored Anesthesia Care Procedure:                Pre-Anesthesia Assessment:                           - Prior to the procedure, a History and Physical                            was performed, and patient medications and                            allergies were reviewed. The patient's tolerance of                            previous anesthesia was also reviewed. The risks                            and benefits of the procedure and the sedation                            options and risks were discussed with the patient.                            All questions were answered, and informed consent                            was obtained. Prior Anticoagulants: The patient has                            taken no previous anticoagulant or antiplatelet                            agents. ASA Grade Assessment: II - A patient with  mild systemic disease. After reviewing the risks                            and benefits, the patient was deemed in                            satisfactory condition to undergo the procedure.                           After obtaining informed consent, the colonoscope                            was passed under direct vision. Throughout the                            procedure, the patient's blood  pressure, pulse, and                            oxygen saturations were monitored continuously. The                            Model CF-HQ190L [id number]) scope was introduced                            through the anus and advanced to the the cecum,                            identified by appendiceal orifice and ileocecal                            valve. The ileocecal valve, appendiceal orifice,                            and rectum were photographed. The quality of the                            bowel preparation was excellent. The colonoscopy                            was performed without difficulty. The patient                            tolerated the procedure well. The bowel preparation                            used was SUPREP. Scope In: 8:03:41 AM Scope Out: 8:13:17 AM Scope Withdrawal Time: 0 hours 7 minutes 35 seconds  Total Procedure Duration: 0 hours 9 minutes 36 seconds  Findings:                 The entire examined colon appeared normal on direct                            and retroflexion views. Complications:  No immediate complications. Estimated blood loss:                            None. Estimated Blood Loss:     Estimated blood loss: none. Impression:               - The entire examined colon is normal on direct and                            retroflexion views.                           - No specimens collected. Recommendation:           - Repeat colonoscopy in 10 years for surveillance.                           - Patient has a contact number available for                            emergencies. The signs and symptoms of potential                            delayed complications were discussed with the                            patient. Return to normal activities tomorrow.                            Written discharge instructions were provided to the                            patient.                           - Resume previous diet.                            - Continue present medications. Docia Chuck. Henrene Pastor, MD 10/12/2016 8:20:25 AM This report has been signed electronically.

## 2016-10-12 NOTE — Progress Notes (Signed)
A and O x3. Report to RN. Tolerated MAC anesthesia well. 

## 2016-10-15 ENCOUNTER — Telehealth: Payer: Self-pay

## 2016-10-15 NOTE — Telephone Encounter (Signed)
  Follow up Call-  Call back number 10/12/2016  Post procedure Call Back phone  # [phone]  Permission to leave phone message Yes  Some recent data might be hidden     Patient questions:  Do you have a fever, pain , or abdominal swelling? No. Pain Score  0 *  Have you tolerated food without any problems? Yes.    Have you been able to return to your normal activities? Yes.    Do you have any questions about your discharge instructions: Diet   No. Medications  No. Follow up visit  No.  Do you have questions or concerns about your Care? No.  Actions: * If pain score is 4 or above: No action needed, pain <4.

## 2016-10-22 DIAGNOSIS — E119 Type 2 diabetes mellitus without complications: Secondary | ICD-10-CM | POA: Diagnosis not present

## 2016-10-22 DIAGNOSIS — Z6826 Body mass index (BMI) 26.0-26.9, adult: Secondary | ICD-10-CM | POA: Diagnosis not present

## 2016-10-22 DIAGNOSIS — E782 Mixed hyperlipidemia: Secondary | ICD-10-CM | POA: Diagnosis not present

## 2016-10-22 DIAGNOSIS — I10 Essential (primary) hypertension: Secondary | ICD-10-CM | POA: Diagnosis not present

## 2016-10-22 DIAGNOSIS — Z23 Encounter for immunization: Secondary | ICD-10-CM | POA: Diagnosis not present

## 2016-10-22 DIAGNOSIS — D126 Benign neoplasm of colon, unspecified: Secondary | ICD-10-CM | POA: Diagnosis not present

## 2016-10-22 DIAGNOSIS — I5030 Unspecified diastolic (congestive) heart failure: Secondary | ICD-10-CM | POA: Diagnosis not present

## 2016-12-21 DIAGNOSIS — Z803 Family history of malignant neoplasm of breast: Secondary | ICD-10-CM | POA: Diagnosis not present

## 2016-12-21 DIAGNOSIS — Z1231 Encounter for screening mammogram for malignant neoplasm of breast: Secondary | ICD-10-CM | POA: Diagnosis not present

## 2016-12-26 DIAGNOSIS — Z124 Encounter for screening for malignant neoplasm of cervix: Secondary | ICD-10-CM | POA: Diagnosis not present

## 2016-12-26 DIAGNOSIS — Z01419 Encounter for gynecological examination (general) (routine) without abnormal findings: Secondary | ICD-10-CM | POA: Diagnosis not present

## 2017-01-01 DIAGNOSIS — L821 Other seborrheic keratosis: Secondary | ICD-10-CM | POA: Diagnosis not present

## 2017-01-01 DIAGNOSIS — L820 Inflamed seborrheic keratosis: Secondary | ICD-10-CM | POA: Diagnosis not present

## 2017-01-08 DIAGNOSIS — E119 Type 2 diabetes mellitus without complications: Secondary | ICD-10-CM | POA: Diagnosis not present

## 2017-01-08 DIAGNOSIS — H401131 Primary open-angle glaucoma, bilateral, mild stage: Secondary | ICD-10-CM | POA: Diagnosis not present

## 2017-02-18 DIAGNOSIS — H401131 Primary open-angle glaucoma, bilateral, mild stage: Secondary | ICD-10-CM | POA: Diagnosis not present

## 2017-03-13 DIAGNOSIS — Z6826 Body mass index (BMI) 26.0-26.9, adult: Secondary | ICD-10-CM | POA: Diagnosis not present

## 2017-03-13 DIAGNOSIS — Z1389 Encounter for screening for other disorder: Secondary | ICD-10-CM | POA: Diagnosis not present

## 2017-03-13 DIAGNOSIS — E119 Type 2 diabetes mellitus without complications: Secondary | ICD-10-CM | POA: Diagnosis not present

## 2017-03-13 DIAGNOSIS — I10 Essential (primary) hypertension: Secondary | ICD-10-CM | POA: Diagnosis not present

## 2017-03-13 DIAGNOSIS — E782 Mixed hyperlipidemia: Secondary | ICD-10-CM | POA: Diagnosis not present

## 2017-03-13 DIAGNOSIS — I5030 Unspecified diastolic (congestive) heart failure: Secondary | ICD-10-CM | POA: Diagnosis not present

## 2017-04-15 DIAGNOSIS — J209 Acute bronchitis, unspecified: Secondary | ICD-10-CM | POA: Diagnosis not present

## 2017-04-15 DIAGNOSIS — Z6826 Body mass index (BMI) 26.0-26.9, adult: Secondary | ICD-10-CM | POA: Diagnosis not present

## 2017-04-15 DIAGNOSIS — R05 Cough: Secondary | ICD-10-CM | POA: Diagnosis not present

## 2017-06-21 DIAGNOSIS — R8299 Other abnormal findings in urine: Secondary | ICD-10-CM | POA: Diagnosis not present

## 2017-06-21 DIAGNOSIS — E119 Type 2 diabetes mellitus without complications: Secondary | ICD-10-CM | POA: Diagnosis not present

## 2017-06-21 DIAGNOSIS — E784 Other hyperlipidemia: Secondary | ICD-10-CM | POA: Diagnosis not present

## 2017-06-21 DIAGNOSIS — N390 Urinary tract infection, site not specified: Secondary | ICD-10-CM | POA: Diagnosis not present

## 2017-06-21 DIAGNOSIS — M810 Age-related osteoporosis without current pathological fracture: Secondary | ICD-10-CM | POA: Diagnosis not present

## 2017-06-21 DIAGNOSIS — I10 Essential (primary) hypertension: Secondary | ICD-10-CM | POA: Diagnosis not present

## 2017-06-27 DIAGNOSIS — F419 Anxiety disorder, unspecified: Secondary | ICD-10-CM | POA: Diagnosis not present

## 2017-06-27 DIAGNOSIS — M810 Age-related osteoporosis without current pathological fracture: Secondary | ICD-10-CM | POA: Diagnosis not present

## 2017-06-27 DIAGNOSIS — Z6826 Body mass index (BMI) 26.0-26.9, adult: Secondary | ICD-10-CM | POA: Diagnosis not present

## 2017-06-27 DIAGNOSIS — Z0000 Encounter for general adult medical examination without abnormal findings: Secondary | ICD-10-CM | POA: Diagnosis not present

## 2017-06-27 DIAGNOSIS — D126 Benign neoplasm of colon, unspecified: Secondary | ICD-10-CM | POA: Diagnosis not present

## 2017-06-27 DIAGNOSIS — E782 Mixed hyperlipidemia: Secondary | ICD-10-CM | POA: Diagnosis not present

## 2017-06-27 DIAGNOSIS — Z1389 Encounter for screening for other disorder: Secondary | ICD-10-CM | POA: Diagnosis not present

## 2017-06-27 DIAGNOSIS — I10 Essential (primary) hypertension: Secondary | ICD-10-CM | POA: Diagnosis not present

## 2017-06-27 DIAGNOSIS — Z23 Encounter for immunization: Secondary | ICD-10-CM | POA: Diagnosis not present

## 2017-06-27 DIAGNOSIS — G4700 Insomnia, unspecified: Secondary | ICD-10-CM | POA: Diagnosis not present

## 2017-06-27 DIAGNOSIS — I5030 Unspecified diastolic (congestive) heart failure: Secondary | ICD-10-CM | POA: Diagnosis not present

## 2017-06-27 DIAGNOSIS — E119 Type 2 diabetes mellitus without complications: Secondary | ICD-10-CM | POA: Diagnosis not present

## 2017-07-17 DIAGNOSIS — E871 Hypo-osmolality and hyponatremia: Secondary | ICD-10-CM

## 2017-07-17 DIAGNOSIS — R112 Nausea with vomiting, unspecified: Secondary | ICD-10-CM

## 2017-07-17 HISTORY — DX: Nausea with vomiting, unspecified: R11.2

## 2017-07-17 HISTORY — DX: Hypo-osmolality and hyponatremia: E87.1

## 2017-07-25 DIAGNOSIS — H401131 Primary open-angle glaucoma, bilateral, mild stage: Secondary | ICD-10-CM | POA: Diagnosis not present

## 2017-07-29 ENCOUNTER — Encounter (HOSPITAL_COMMUNITY): Payer: Self-pay | Admitting: Emergency Medicine

## 2017-07-29 ENCOUNTER — Emergency Department (HOSPITAL_COMMUNITY)
Admission: EM | Admit: 2017-07-29 | Discharge: 2017-07-29 | Disposition: A | Discharge status: Home / Self Care | Payer: Medicare Other | Source: Home / Self Care | Attending: Emergency Medicine | Admitting: Emergency Medicine

## 2017-07-29 DIAGNOSIS — Z7982 Long term (current) use of aspirin: Secondary | ICD-10-CM | POA: Insufficient documentation

## 2017-07-29 DIAGNOSIS — I10 Essential (primary) hypertension: Secondary | ICD-10-CM

## 2017-07-29 DIAGNOSIS — Z87891 Personal history of nicotine dependence: Secondary | ICD-10-CM

## 2017-07-29 DIAGNOSIS — R112 Nausea with vomiting, unspecified: Secondary | ICD-10-CM | POA: Insufficient documentation

## 2017-07-29 DIAGNOSIS — R197 Diarrhea, unspecified: Principal | ICD-10-CM

## 2017-07-29 DIAGNOSIS — N179 Acute kidney failure, unspecified: Secondary | ICD-10-CM | POA: Diagnosis not present

## 2017-07-29 DIAGNOSIS — Z79899 Other long term (current) drug therapy: Secondary | ICD-10-CM

## 2017-07-29 DIAGNOSIS — E871 Hypo-osmolality and hyponatremia: Secondary | ICD-10-CM | POA: Diagnosis not present

## 2017-07-29 DIAGNOSIS — A084 Viral intestinal infection, unspecified: Secondary | ICD-10-CM | POA: Diagnosis not present

## 2017-07-29 DIAGNOSIS — E119 Type 2 diabetes mellitus without complications: Secondary | ICD-10-CM

## 2017-07-29 DIAGNOSIS — D649 Anemia, unspecified: Secondary | ICD-10-CM | POA: Diagnosis not present

## 2017-07-29 DIAGNOSIS — J690 Pneumonitis due to inhalation of food and vomit: Secondary | ICD-10-CM | POA: Diagnosis not present

## 2017-07-29 DIAGNOSIS — E876 Hypokalemia: Secondary | ICD-10-CM | POA: Diagnosis not present

## 2017-07-29 DIAGNOSIS — E872 Acidosis: Secondary | ICD-10-CM | POA: Diagnosis not present

## 2017-07-29 DIAGNOSIS — E860 Dehydration: Secondary | ICD-10-CM | POA: Diagnosis not present

## 2017-07-29 LAB — CBC
HCT: 35.4 % — ABNORMAL LOW (ref 36.0–46.0)
Hemoglobin: 12.6 g/dL (ref 12.0–15.0)
MCH: 29.9 pg (ref 26.0–34.0)
MCHC: 35.6 g/dL (ref 30.0–36.0)
MCV: 83.9 fL (ref 78.0–100.0)
PLATELETS: 263 10*3/uL (ref 150–400)
RBC: 4.22 MIL/uL (ref 3.87–5.11)
RDW: 13.6 % (ref 11.5–15.5)
WBC: 10.5 10*3/uL (ref 4.0–10.5)

## 2017-07-29 LAB — LIPASE, BLOOD: LIPASE: 33 U/L (ref 11–51)

## 2017-07-29 LAB — COMPREHENSIVE METABOLIC PANEL
ALBUMIN: 4 g/dL (ref 3.5–5.0)
ALT: 33 U/L (ref 14–54)
AST: 36 U/L (ref 15–41)
Alkaline Phosphatase: 37 U/L — ABNORMAL LOW (ref 38–126)
Anion gap: 13 (ref 5–15)
BUN: 24 mg/dL — ABNORMAL HIGH (ref 6–20)
CHLORIDE: 93 mmol/L — AB (ref 101–111)
CO2: 24 mmol/L (ref 22–32)
CREATININE: 1.1 mg/dL — AB (ref 0.44–1.00)
Calcium: 9.7 mg/dL (ref 8.9–10.3)
GFR calc non Af Amer: 50 mL/min — ABNORMAL LOW (ref >60)
GFR, EST AFRICAN AMERICAN: 58 mL/min — AB (ref >60)
GLUCOSE: 175 mg/dL — AB (ref 65–99)
Potassium: 3.5 mmol/L (ref 3.5–5.1)
SODIUM: 130 mmol/L — AB (ref 135–145)
Total Bilirubin: 0.9 mg/dL (ref 0.3–1.2)
Total Protein: 7.9 g/dL (ref 6.5–8.1)

## 2017-07-29 MED ORDER — ONDANSETRON 4 MG PO TBDP: 4.0000 mg | ORAL_TABLET | Freq: Three times a day (TID) | ORAL | 0 refills | Status: DC | PRN

## 2017-07-29 MED ORDER — ONDANSETRON 4 MG PO TBDP
4.0000 mg | ORAL_TABLET | Freq: Once | ORAL | Status: AC
  Administered 2017-07-29: 4 mg via ORAL
  Filled 2017-07-29: qty 1

## 2017-07-29 NOTE — ED Triage Notes (Signed)
Pt states that she has had N/V since Friday. Sent in by Dr. Radene Gunning to check for dehydration and possibly get fluids. Alert and oriented. Denies pain.

## 2017-07-29 NOTE — ED Provider Notes (Signed)
[facility] DEPT Provider Note   CSN: [id number] Arrival date & time: 07/29/17  1005     History   Chief Complaint Chief Complaint  Patient presents with  . Emesis    HPI Kimberly Huynh is a 66 y.o. female.  The history is provided by the patient.  Emesis   This is a new problem. The current episode started 2 days ago. The problem occurs 2 to 4 times per day. The problem has not changed since onset.The emesis has an appearance of stomach contents. There has been no fever. Associated symptoms include diarrhea (started today). Pertinent negatives include no abdominal pain, no chills and no fever.   Sent her by PCP to check for dehydration.   Past Medical History:  Diagnosis Date  . Allergy    seasonal  . Anxiety   . Arthritis    slight amt of arthrits in neck  . Cataract    right eye  . Clotting disorder ([place])    pt feels like she bleeds freely, feels like blood is "thin" 09-13-16  . Diabetes mellitus    Diet Controlled  . GERD (gastroesophageal reflux disease)   . Glucose intolerance (pre-diabetes)   . Heart murmur   . Hyperlipidemia   . Hypertension   . Obesity   . 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 Abnormal EKG 04/23/2011  . Hypertension   . Hyperlipidemia   . Anxiety   . Obesity   . Glucose intolerance (pre-diabetes)     Past Surgical History:  Procedure Laterality Date  . CERVIX LESION DESTRUCTION     1977  . COLONOSCOPY    . THUMB ARTHROSCOPY  11/13/2009   Release of A1 pulley, right thumb. / Stenosing tenosynovitis, right thumb  . TONSILLECTOMY AND ADENOIDECTOMY  1967    OB History    No data available       Home Medications    Prior to Admission medications   Medication Sig Start Date End Date Taking? Authorizing Provider  atenolol (TENORMIN) 25 MG tablet Take 25 mg by mouth daily.   Yes [provider]  atorvastatin (LIPITOR) 20 MG tablet Take 20 mg by mouth daily.   Yes  [provider]  ALPRAZolam Duanne Moron) 0.5 MG tablet Take 0.25 mg by mouth at bedtime as needed.      [provider]  aspirin 81 MG tablet Take 81 mg by mouth daily.      [provider]  Cholecalciferol (VITAMIN D) 1000 UNITS capsule Take 1,000 Units by mouth 2 (two) times daily.      [provider]  Choline Fenofibrate 135 MG capsule Take 1 capsule (135 mg total) by mouth daily. 08/10/11   Burtis Junes, NP  ezetimibe (ZETIA) 10 MG tablet Take 1 tablet (10 mg total) by mouth daily. 01/19/11 01/19/12  Romeo Apple, MD  ezetimibe (ZETIA) 10 MG tablet Take 10 mg by mouth daily.    [provider]  Famotidine (PEPCID AC PO) Take by mouth as needed.    [provider]  levocetirizine (XYZAL) 5 MG tablet Take 5 mg by mouth every evening.      [provider]  olmesartan-hydrochlorothiazide (BENICAR HCT) 20-12.5 MG tablet Take 1 tablet by mouth daily. 07/04/17   [provider]  ondansetron (ZOFRAN ODT) 4 MG disintegrating tablet Take 1 tablet (4 mg total) by mouth every 8 (eight) hours as needed for nausea  or vomiting. 07/29/17 08/01/17  Fatima Blank, MD  ONE TOUCH ULTRA TEST test strip  07/03/11   [provider]  SitaGLIPtin-MetFORMIN HCl (JANUMET XR) [phone] MG TB24 Take 1 tablet by mouth daily. 06/13/16   [provider]  Timolol Hemihydrate (BETIMOL OP) Apply 1 drop to eye daily. LEFT EYE    [provider]  valsartan-hydrochlorothiazide (DIOVAN-HCT) 80-12.5 MG per tablet Take 1 tablet by mouth daily. Patient not taking: Reported on 07/29/2017 12/24/12   Minus Breeding, MD    Family History Family History  Problem Relation Age of Onset  . Stroke Father   . Diabetes Mother   . Hypertension Brother   . Hypertension Brother   . Hypertension Brother   . Hypertension Sister   . Diabetes Brother   . Colon cancer Neg Hx   . Esophageal cancer Neg Hx   . Rectal cancer Neg Hx   . Stomach  cancer Neg Hx     Social History Social History  Substance Use Topics  . Smoking status: Former Smoker    Packs/day: 0.80    Years: 10.00    Types: Cigarettes    Quit date: 12/18/1995  . Smokeless tobacco: Never Used  . Alcohol use 0.0 oz/week    3 - 4 Glasses of wine per week     Comment: Occ     Allergies   Amoxicillin-pot clavulanate and Travoprost   Review of Systems Review of Systems  Constitutional: Negative for chills and fever.  Gastrointestinal: Positive for diarrhea (started today) and vomiting. Negative for abdominal pain.   All other systems are reviewed and are negative for acute change except as noted in the HPI   Physical Exam Updated Vital Signs BP 120/73   Pulse 63   Temp 97.9 F (36.6 C)   Resp 14   SpO2 96%   Physical Exam  Constitutional: She is oriented to person, place, and time. She appears well-developed and well-nourished. No distress.  HENT:  Head: Normocephalic and atraumatic.  Nose: Nose normal.  Eyes: Pupils are equal, round, and reactive to light. Conjunctivae and EOM are normal. Right eye exhibits no discharge. Left eye exhibits no discharge. No scleral icterus.  Neck: Normal range of motion. Neck supple.  Cardiovascular: Normal rate and regular rhythm.  Exam reveals no gallop and no friction rub.   No murmur heard. Pulmonary/Chest: Effort normal and breath sounds normal. No stridor. No respiratory distress. She has no rales.  Abdominal: Soft. She exhibits no distension. There is no tenderness.  Musculoskeletal: She exhibits no edema or tenderness.  Neurological: She is alert and oriented to person, place, and time.  Skin: Skin is warm and dry. No rash noted. She is not diaphoretic. No erythema.  Psychiatric: She has a normal mood and affect.  Vitals reviewed.    ED Treatments / Results  Labs (all labs ordered are listed, but only abnormal results are displayed) Labs Reviewed  COMPREHENSIVE METABOLIC PANEL - Abnormal; Notable  for the following:       Result Value   Sodium 130 (*)    Chloride 93 (*)    Glucose, Bld 175 (*)    BUN 24 (*)    Creatinine, Ser 1.10 (*)    Alkaline Phosphatase 37 (*)    GFR calc non Af Amer 50 (*)    GFR calc Af Amer 58 (*)    All other components within normal limits  CBC - Abnormal; Notable for the following:    HCT  35.4 (*)    All other components within normal limits  LIPASE, BLOOD    EKG  EKG Interpretation None       Radiology No results found.  Procedures Procedures (including critical care time)  Medications Ordered in ED Medications  ondansetron (ZOFRAN-ODT) disintegrating tablet 4 mg (4 mg Oral Given 07/29/17 1320)     Initial Impression / Assessment and Plan / ED Course  I have reviewed the triage vital signs and the nursing notes.  Pertinent labs & imaging results that were available during my care of the patient were reviewed by me and considered in my medical decision making (see chart for details).     66 y.o. female presents with vomiting, diarrhea for 3 days. Inadequate oral tolerance. Rest of history as above.  Patient appears well, not in distress, and with no signs of toxicity or dehydration. Abdomen benign. Rest of the exam as above  Most consistent with viral gastroenteritis.   No evidence of suggestive of cholinergic toxidrome. Doubt appendicitis, diverticulitis, severe colitis, dysentery.    Able to tolerate oral intake in the ED.  Discussed symptomatic treatment with the parents and they will follow closely with their PCP.   Final Clinical Impressions(s) / ED Diagnoses   Final diagnoses:  Nausea vomiting and diarrhea   Disposition: Discharge  Condition: Good  I have discussed the results, Dx and Tx plan with the patient who expressed understanding and agree(s) with the plan. Discharge instructions discussed at great length. The patient was given strict return precautions who verbalized understanding of the instructions. No  further questions at time of discharge.    New Prescriptions   ONDANSETRON (ZOFRAN ODT) 4 MG DISINTEGRATING TABLET    Take 1 tablet (4 mg total) by mouth every 8 (eight) hours as needed for nausea or vomiting.    Follow Up: [address], [place] [address] [phone]  Schedule an appointment as soon as possible for a visit  As needed, If symptoms do not improve or  worsen      Cardama, Grayce Sessions, MD 07/29/17 1411

## 2017-07-31 ENCOUNTER — Observation Stay (HOSPITAL_COMMUNITY): Payer: Medicare Other

## 2017-07-31 ENCOUNTER — Inpatient Hospital Stay (HOSPITAL_COMMUNITY)
Admission: EM | Admit: 2017-07-31 | Discharge: 2017-08-03 | DRG: 391 | Disposition: A | Discharge status: Home / Self Care | Payer: Medicare Other | Attending: Internal Medicine | Admitting: Internal Medicine

## 2017-07-31 ENCOUNTER — Encounter (HOSPITAL_COMMUNITY): Payer: Self-pay | Admitting: Emergency Medicine

## 2017-07-31 DIAGNOSIS — Z79899 Other long term (current) drug therapy: Secondary | ICD-10-CM

## 2017-07-31 DIAGNOSIS — D649 Anemia, unspecified: Secondary | ICD-10-CM | POA: Diagnosis present

## 2017-07-31 DIAGNOSIS — Z8249 Family history of ischemic heart disease and other diseases of the circulatory system: Secondary | ICD-10-CM

## 2017-07-31 DIAGNOSIS — R197 Diarrhea, unspecified: Secondary | ICD-10-CM

## 2017-07-31 DIAGNOSIS — Z823 Family history of stroke: Secondary | ICD-10-CM

## 2017-07-31 DIAGNOSIS — E118 Type 2 diabetes mellitus with unspecified complications: Secondary | ICD-10-CM

## 2017-07-31 DIAGNOSIS — E871 Hypo-osmolality and hyponatremia: Secondary | ICD-10-CM | POA: Diagnosis not present

## 2017-07-31 DIAGNOSIS — E860 Dehydration: Secondary | ICD-10-CM | POA: Diagnosis present

## 2017-07-31 DIAGNOSIS — R509 Fever, unspecified: Secondary | ICD-10-CM | POA: Diagnosis not present

## 2017-07-31 DIAGNOSIS — E785 Hyperlipidemia, unspecified: Secondary | ICD-10-CM | POA: Diagnosis not present

## 2017-07-31 DIAGNOSIS — R7401 Elevation of levels of liver transaminase levels: Secondary | ICD-10-CM

## 2017-07-31 DIAGNOSIS — E119 Type 2 diabetes mellitus without complications: Secondary | ICD-10-CM | POA: Diagnosis present

## 2017-07-31 DIAGNOSIS — R740 Nonspecific elevation of levels of transaminase and lactic acid dehydrogenase [LDH]: Secondary | ICD-10-CM | POA: Diagnosis present

## 2017-07-31 DIAGNOSIS — E872 Acidosis, unspecified: Secondary | ICD-10-CM

## 2017-07-31 DIAGNOSIS — J189 Pneumonia, unspecified organism: Secondary | ICD-10-CM | POA: Diagnosis present

## 2017-07-31 DIAGNOSIS — Z9109 Other allergy status, other than to drugs and biological substances: Secondary | ICD-10-CM

## 2017-07-31 DIAGNOSIS — Z6826 Body mass index (BMI) 26.0-26.9, adult: Secondary | ICD-10-CM

## 2017-07-31 DIAGNOSIS — Z87891 Personal history of nicotine dependence: Secondary | ICD-10-CM

## 2017-07-31 DIAGNOSIS — Z7982 Long term (current) use of aspirin: Secondary | ICD-10-CM

## 2017-07-31 DIAGNOSIS — I10 Essential (primary) hypertension: Secondary | ICD-10-CM | POA: Diagnosis present

## 2017-07-31 DIAGNOSIS — K8020 Calculus of gallbladder without cholecystitis without obstruction: Secondary | ICD-10-CM | POA: Diagnosis not present

## 2017-07-31 DIAGNOSIS — M810 Age-related osteoporosis without current pathological fracture: Secondary | ICD-10-CM | POA: Diagnosis present

## 2017-07-31 DIAGNOSIS — K529 Noninfective gastroenteritis and colitis, unspecified: Secondary | ICD-10-CM | POA: Diagnosis present

## 2017-07-31 DIAGNOSIS — R1110 Vomiting, unspecified: Secondary | ICD-10-CM | POA: Diagnosis present

## 2017-07-31 DIAGNOSIS — R112 Nausea with vomiting, unspecified: Secondary | ICD-10-CM | POA: Diagnosis not present

## 2017-07-31 DIAGNOSIS — F419 Anxiety disorder, unspecified: Secondary | ICD-10-CM | POA: Diagnosis present

## 2017-07-31 DIAGNOSIS — E669 Obesity, unspecified: Secondary | ICD-10-CM | POA: Diagnosis present

## 2017-07-31 DIAGNOSIS — N179 Acute kidney failure, unspecified: Secondary | ICD-10-CM | POA: Diagnosis present

## 2017-07-31 DIAGNOSIS — Z833 Family history of diabetes mellitus: Secondary | ICD-10-CM

## 2017-07-31 DIAGNOSIS — J690 Pneumonitis due to inhalation of food and vomit: Secondary | ICD-10-CM | POA: Diagnosis present

## 2017-07-31 DIAGNOSIS — J302 Other seasonal allergic rhinitis: Secondary | ICD-10-CM | POA: Diagnosis present

## 2017-07-31 DIAGNOSIS — E876 Hypokalemia: Secondary | ICD-10-CM | POA: Diagnosis not present

## 2017-07-31 DIAGNOSIS — K219 Gastro-esophageal reflux disease without esophagitis: Secondary | ICD-10-CM | POA: Diagnosis present

## 2017-07-31 DIAGNOSIS — A084 Viral intestinal infection, unspecified: Principal | ICD-10-CM | POA: Diagnosis present

## 2017-07-31 DIAGNOSIS — E7439 Other disorders of intestinal carbohydrate absorption: Secondary | ICD-10-CM | POA: Diagnosis present

## 2017-07-31 DIAGNOSIS — Z881 Allergy status to other antibiotic agents status: Secondary | ICD-10-CM

## 2017-07-31 DIAGNOSIS — R17 Unspecified jaundice: Secondary | ICD-10-CM

## 2017-07-31 HISTORY — DX: Hypo-osmolality and hyponatremia: E87.1

## 2017-07-31 HISTORY — DX: Nausea with vomiting, unspecified: R11.2

## 2017-07-31 LAB — CBC
HCT: 33.7 % — ABNORMAL LOW (ref 36.0–46.0)
Hemoglobin: 11.5 g/dL — ABNORMAL LOW (ref 12.0–15.0)
MCH: 28.9 pg (ref 26.0–34.0)
MCHC: 34.1 g/dL (ref 30.0–36.0)
MCV: 84.7 fL (ref 78.0–100.0)
PLATELETS: 261 10*3/uL (ref 150–400)
RBC: 3.98 MIL/uL (ref 3.87–5.11)
RDW: 13.8 % (ref 11.5–15.5)
WBC: 11 10*3/uL — AB (ref 4.0–10.5)

## 2017-07-31 LAB — COMPREHENSIVE METABOLIC PANEL
ALK PHOS: 34 U/L — AB (ref 38–126)
ALT: 51 U/L (ref 14–54)
AST: 53 U/L — AB (ref 15–41)
Albumin: 3.2 g/dL — ABNORMAL LOW (ref 3.5–5.0)
Anion gap: 18 — ABNORMAL HIGH (ref 5–15)
BILIRUBIN TOTAL: 1.4 mg/dL — AB (ref 0.3–1.2)
BUN: 29 mg/dL — AB (ref 6–20)
CALCIUM: 9.4 mg/dL (ref 8.9–10.3)
CO2: 18 mmol/L — ABNORMAL LOW (ref 22–32)
CREATININE: 1.29 mg/dL — AB (ref 0.44–1.00)
Chloride: 90 mmol/L — ABNORMAL LOW (ref 101–111)
GFR, EST AFRICAN AMERICAN: 48 mL/min — AB (ref >60)
GFR, EST NON AFRICAN AMERICAN: 42 mL/min — AB (ref >60)
Glucose, Bld: 143 mg/dL — ABNORMAL HIGH (ref 65–99)
Potassium: 3.2 mmol/L — ABNORMAL LOW (ref 3.5–5.1)
Sodium: 126 mmol/L — ABNORMAL LOW (ref 135–145)
TOTAL PROTEIN: 6.9 g/dL (ref 6.5–8.1)

## 2017-07-31 LAB — LIPASE, BLOOD: Lipase: 37 U/L (ref 11–51)

## 2017-07-31 LAB — I-STAT CG4 LACTIC ACID, ED
LACTIC ACID, VENOUS: 1.28 mmol/L (ref 0.5–1.9)
LACTIC ACID, VENOUS: 1.16 mmol/L (ref 0.5–1.9)

## 2017-07-31 LAB — GLUCOSE, CAPILLARY
GLUCOSE-CAPILLARY: 124 mg/dL — AB (ref 65–99)
GLUCOSE-CAPILLARY: 125 mg/dL — AB (ref 65–99)
GLUCOSE-CAPILLARY: 138 mg/dL — AB (ref 65–99)
GLUCOSE-CAPILLARY: 155 mg/dL — AB (ref 65–99)
Glucose-Capillary: 136 mg/dL — ABNORMAL HIGH (ref 65–99)

## 2017-07-31 LAB — DIFFERENTIAL
BASOS PCT: 0 %
Basophils Absolute: 0 10*3/uL (ref 0.0–0.1)
EOS ABS: 0 10*3/uL (ref 0.0–0.7)
EOS PCT: 0 %
LYMPHS ABS: 1.2 10*3/uL (ref 0.7–4.0)
Lymphocytes Relative: 11 %
Monocytes Absolute: 0.9 10*3/uL (ref 0.1–1.0)
Monocytes Relative: 8 %
NEUTROS PCT: 81 %
Neutro Abs: 8.9 10*3/uL — ABNORMAL HIGH (ref 1.7–7.7)

## 2017-07-31 MED ORDER — INSULIN ASPART 100 UNIT/ML ~~LOC~~ SOLN
0.0000 [IU] | Freq: Three times a day (TID) | SUBCUTANEOUS | Status: DC
  Administered 2017-07-31: 2 [IU] via SUBCUTANEOUS
  Administered 2017-08-02: 1 [IU] via SUBCUTANEOUS
  Administered 2017-08-02: 2 [IU] via SUBCUTANEOUS
  Administered 2017-08-03: 1 [IU] via SUBCUTANEOUS

## 2017-07-31 MED ORDER — ONDANSETRON HCL 4 MG PO TABS: 4.0000 mg | ORAL_TABLET | Freq: Four times a day (QID) | ORAL | Status: DC | PRN

## 2017-07-31 MED ORDER — DEXTROSE 5 % IV SOLN
1.0000 g | INTRAVENOUS | Status: DC
  Administered 2017-07-31 – 2017-08-02 (×3): 1 g via INTRAVENOUS
  Filled 2017-07-31 (×5): qty 10

## 2017-07-31 MED ORDER — POTASSIUM CHLORIDE IN NACL 20-0.9 MEQ/L-% IV SOLN
INTRAVENOUS | Status: DC
  Administered 2017-07-31 – 2017-08-03 (×5): via INTRAVENOUS
  Filled 2017-07-31 (×6): qty 1000

## 2017-07-31 MED ORDER — ATENOLOL 25 MG PO TABS
25.0000 mg | ORAL_TABLET | Freq: Every day | ORAL | Status: DC
  Administered 2017-08-01 – 2017-08-03 (×3): 25 mg via ORAL
  Filled 2017-07-31 (×4): qty 1

## 2017-07-31 MED ORDER — ONDANSETRON HCL 4 MG/2ML IJ SOLN: 4.0000 mg | Freq: Four times a day (QID) | INTRAMUSCULAR | Status: DC | PRN

## 2017-07-31 MED ORDER — FLUTICASONE PROPIONATE 50 MCG/ACT NA SUSP
2.0000 | Freq: Every day | NASAL | Status: DC
  Administered 2017-07-31 – 2017-08-03 (×4): 2 via NASAL
  Filled 2017-07-31: qty 16

## 2017-07-31 MED ORDER — ASPIRIN 81 MG PO CHEW
81.0000 mg | CHEWABLE_TABLET | Freq: Every day | ORAL | Status: DC
  Administered 2017-07-31 – 2017-08-03 (×4): 81 mg via ORAL
  Filled 2017-07-31 (×5): qty 1

## 2017-07-31 MED ORDER — TIMOLOL MALEATE 0.25 % OP SOLN
1.0000 [drp] | Freq: Two times a day (BID) | OPHTHALMIC | Status: DC
  Administered 2017-07-31 – 2017-08-03 (×7): 1 [drp] via OPHTHALMIC
  Filled 2017-07-31: qty 5

## 2017-07-31 MED ORDER — DIPHENHYDRAMINE HCL 50 MG/ML IJ SOLN
25.0000 mg | Freq: Once | INTRAMUSCULAR | Status: AC
  Administered 2017-07-31: 25 mg via INTRAVENOUS
  Filled 2017-07-31: qty 1

## 2017-07-31 MED ORDER — ENOXAPARIN SODIUM 40 MG/0.4ML ~~LOC~~ SOLN
40.0000 mg | SUBCUTANEOUS | Status: DC
  Administered 2017-07-31 – 2017-08-02 (×3): 40 mg via SUBCUTANEOUS
  Filled 2017-07-31 (×3): qty 0.4

## 2017-07-31 MED ORDER — ACETAMINOPHEN 650 MG RE SUPP: 650.0000 mg | Freq: Four times a day (QID) | RECTAL | Status: DC | PRN

## 2017-07-31 MED ORDER — DEXTROSE 5 % IV SOLN
500.0000 mg | INTRAVENOUS | Status: DC
  Administered 2017-07-31: 500 mg via INTRAVENOUS
  Filled 2017-07-31 (×2): qty 500

## 2017-07-31 MED ORDER — SODIUM CHLORIDE 0.9 % IV SOLN
INTRAVENOUS | Status: AC
  Administered 2017-07-31: 09:00:00 via INTRAVENOUS

## 2017-07-31 MED ORDER — METOCLOPRAMIDE HCL 5 MG/ML IJ SOLN
10.0000 mg | Freq: Once | INTRAMUSCULAR | Status: AC
  Administered 2017-07-31: 10 mg via INTRAVENOUS
  Filled 2017-07-31: qty 2

## 2017-07-31 MED ORDER — ATORVASTATIN CALCIUM 20 MG PO TABS
20.0000 mg | ORAL_TABLET | Freq: Every day | ORAL | Status: DC
  Administered 2017-07-31 – 2017-08-03 (×4): 20 mg via ORAL
  Filled 2017-07-31 (×4): qty 1

## 2017-07-31 MED ORDER — PANTOPRAZOLE SODIUM 40 MG IV SOLR
40.0000 mg | INTRAVENOUS | Status: DC
  Administered 2017-07-31 – 2017-08-03 (×4): 40 mg via INTRAVENOUS
  Filled 2017-07-31 (×4): qty 40

## 2017-07-31 MED ORDER — ACETAMINOPHEN 325 MG PO TABS
650.0000 mg | ORAL_TABLET | Freq: Four times a day (QID) | ORAL | Status: DC | PRN
  Administered 2017-07-31 (×2): 650 mg via ORAL
  Filled 2017-07-31 (×2): qty 2

## 2017-07-31 MED ORDER — SODIUM CHLORIDE 0.9 % IV BOLUS (SEPSIS)
1000.0000 mL | Freq: Once | INTRAVENOUS | Status: AC
  Administered 2017-07-31: 1000 mL via INTRAVENOUS

## 2017-07-31 MED ORDER — BENZONATATE 100 MG PO CAPS
100.0000 mg | ORAL_CAPSULE | Freq: Two times a day (BID) | ORAL | Status: DC | PRN
  Administered 2017-07-31: 100 mg via ORAL
  Filled 2017-07-31: qty 1

## 2017-07-31 MED ORDER — ALPRAZOLAM 0.25 MG PO TABS
0.2500 mg | ORAL_TABLET | Freq: Every evening | ORAL | Status: DC | PRN
  Administered 2017-07-31 – 2017-08-02 (×3): 0.25 mg via ORAL
  Filled 2017-07-31 (×3): qty 1

## 2017-07-31 NOTE — ED Provider Notes (Signed)
[facility] DEPT Provider Note   CSN: [id number] Arrival date & time: 07/31/17  0434     History   Chief Complaint Chief Complaint  Patient presents with  . Emesis  . Diarrhea    HPI Kimberly Huynh is a 66 y.o. female.  The history is provided by the patient.  She comes to the ED with ongoing problems with vomiting and diarrhea. She has had nausea and vomiting for the last 5 days. Diarrhea started the next day. She was seen in emergency 2 days ago and given a prescription for ondansetron. That initially was giving her some relief with nausea, but states she has continued to vomit several times a day and has no appetite. She continues to have several episodes of diarrhea a day as well. She has had fevers at home up to 101.4, along with chills and sweats. There is not any abdominal pain. She denies any sick contacts.  Past Medical History:  Diagnosis Date  . Allergy    seasonal  . Anxiety   . Arthritis    slight amt of arthrits in neck  . Cataract    right eye  . Clotting disorder ([place])    pt feels like she bleeds freely, feels like blood is "thin" 09-13-16  . Diabetes mellitus    Diet Controlled  . GERD (gastroesophageal reflux disease)   . Glucose intolerance (pre-diabetes)   . Heart murmur   . Hyperlipidemia   . Hypertension   . Obesity   . 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 Abnormal EKG 04/23/2011  . Hypertension   . Hyperlipidemia   . Anxiety   . Obesity   . Glucose intolerance (pre-diabetes)     Past Surgical History:  Procedure Laterality Date  . CERVIX LESION DESTRUCTION     1977  . COLONOSCOPY    . THUMB ARTHROSCOPY  11/13/2009   Release of A1 pulley, right thumb. / Stenosing tenosynovitis, right thumb  . TONSILLECTOMY AND ADENOIDECTOMY  1967    OB History    No data available       Home Medications    Prior to Admission medications   Medication Sig Start Date End Date Taking?  Authorizing Provider  ALPRAZolam Duanne Moron) 0.5 MG tablet Take 0.25 mg by mouth at bedtime as needed for anxiety or sleep.     [provider]  aspirin 81 MG tablet Take 81 mg by mouth daily.      [provider]  atenolol (TENORMIN) 25 MG tablet Take 25 mg by mouth daily.    [provider]  atorvastatin (LIPITOR) 20 MG tablet Take 20 mg by mouth daily.    [provider]  Cholecalciferol 2000 units CAPS Take 2,000 Units by mouth daily.    [provider]  Choline Fenofibrate 135 MG capsule Take 1 capsule (135 mg total) by mouth daily. 08/10/11   Burtis Junes, NP  ezetimibe (ZETIA) 10 MG tablet Take 1 tablet (10 mg total) by mouth daily. 01/19/11 01/19/12  Romeo Apple, MD  ezetimibe (ZETIA) 10 MG tablet Take 10 mg by mouth every evening.     [provider]  Famotidine (PEPCID AC PO) Take 1 tablet by mouth as needed (heartburn).     [provider]  fluticasone (FLONASE) 50 MCG/ACT nasal spray Place 2 sprays into both nostrils daily. 07/05/17   [provider]  levocetirizine (XYZAL) 5 MG tablet Take  5 mg by mouth every evening.      [provider]  olmesartan-hydrochlorothiazide (BENICAR HCT) 20-12.5 MG tablet Take 1 tablet by mouth daily. 07/04/17   [provider]  ondansetron (ZOFRAN ODT) 4 MG disintegrating tablet Take 1 tablet (4 mg total) by mouth every 8 (eight) hours as needed for nausea or vomiting. 07/29/17 08/01/17  Cardama, Grayce Sessions, MD  ONE TOUCH ULTRA TEST test strip  07/03/11   [provider]  SitaGLIPtin-MetFORMIN HCl (JANUMET XR) [phone] MG TB24 Take 1 tablet by mouth every evening.  06/13/16   [provider]  sodium chloride (OCEAN) 0.65 % SOLN nasal spray Place 1 spray into both nostrils as needed for congestion.    [provider]  Timolol Hemihydrate (BETIMOL OP) Apply 1 drop to eye 2 (two) times daily. Both eyes    [provider]    valsartan-hydrochlorothiazide (DIOVAN-HCT) 80-12.5 MG per tablet Take 1 tablet by mouth daily. Patient not taking: Reported on 07/29/2017 12/24/12   Minus Breeding, MD    Family History Family History  Problem Relation Age of Onset  . Stroke Father   . Diabetes Mother   . Hypertension Brother   . Hypertension Brother   . Hypertension Brother   . Hypertension Sister   . Diabetes Brother   . Colon cancer Neg Hx   . Esophageal cancer Neg Hx   . Rectal cancer Neg Hx   . Stomach cancer Neg Hx     Social History Social History  Substance Use Topics  . Smoking status: Former Smoker    Packs/day: 0.80    Years: 10.00    Types: Cigarettes    Quit date: 12/18/1995  . Smokeless tobacco: Never Used  . Alcohol use 0.0 oz/week    3 - 4 Glasses of wine per week     Comment: Occ     Allergies   Amoxicillin-pot clavulanate and Travoprost   Review of Systems Review of Systems  All other systems reviewed and are negative.    Physical Exam Updated Vital Signs BP 129/72 (BP Location: Right Arm)   Pulse 75   Temp 100.1 F (37.8 C) (Oral)   Resp 19   Ht 5\' 2"  (1.575 m)   Wt 64.9 kg (143 lb)   SpO2 93%   BMI 26.16 kg/m   Physical Exam  Nursing note and vitals reviewed.  66 year old female, resting comfortably and in no acute distress. Vital signs are normal, but temperature is borderline elevated at 100.1. Oxygen saturation is 93%, which is normal. Head is normocephalic and atraumatic. PERRLA, EOMI. Oropharynx is clear. Neck is nontender and supple without adenopathy or JVD. Back is nontender and there is no CVA tenderness. Lungs are clear without rales, wheezes, or rhonchi. Chest is nontender. Heart has regular rate and rhythm without murmur. Abdomen is soft, flat, nontender without masses or hepatosplenomegaly and peristalsis is normoactive. Extremities have no cyanosis or edema, full range of motion is present. Skin is warm and dry without rash. Neurologic: Mental  status is normal, cranial nerves are intact, there are no motor or sensory deficits.  ED Treatments / Results  Labs (all labs ordered are listed, but only abnormal results are displayed) Labs Reviewed  COMPREHENSIVE METABOLIC PANEL - Abnormal; Notable for the following:       Result Value   Sodium 126 (*)    Potassium 3.2 (*)    Chloride 90 (*)    CO2 18 (*)    Glucose,  Bld 143 (*)    BUN 29 (*)    Creatinine, Ser 1.29 (*)    Albumin 3.2 (*)    AST 53 (*)    Alkaline Phosphatase 34 (*)    Total Bilirubin 1.4 (*)    GFR calc non Af Amer 42 (*)    GFR calc Af Amer 48 (*)    Anion gap 18 (*)    All other components within normal limits  CBC - Abnormal; Notable for the following:    WBC 11.0 (*)    Hemoglobin 11.5 (*)    HCT 33.7 (*)    All other components within normal limits  DIFFERENTIAL - Abnormal; Notable for the following:    Neutro Abs 8.9 (*)    All other components within normal limits  CULTURE, BLOOD (ROUTINE X 2)  CULTURE, BLOOD (ROUTINE X 2)  LIPASE, BLOOD  URINALYSIS, ROUTINE W REFLEX MICROSCOPIC  I-STAT CG4 LACTIC ACID, ED   Procedures Procedures (including critical care time)  Medications Ordered in ED Medications  0.9 %  sodium chloride infusion (not administered)  sodium chloride 0.9 % bolus 1,000 mL (1,000 mLs Intravenous New Bag/Given 07/31/17 0506)  metoCLOPramide (REGLAN) injection 10 mg (10 mg Intravenous Given 07/31/17 0507)  diphenhydrAMINE (BENADRYL) injection 25 mg (25 mg Intravenous Given 07/31/17 0507)     Initial Impression / Assessment and Plan / ED Course  I have reviewed the triage vital signs and the nursing notes.  Pertinent labs & imaging results that were available during my care of the patient were reviewed by me and considered in my medical decision making (see chart for details).  Persistent nausea, vomiting, diarrhea with low-grade fevers. Patient does not appear toxic on exam. Old records reviewed confirming ED visit 2 days ago  for vomiting and diarrhea. She'll be given IV fluids, IV metoclopramide. Screening labs will be checked.  Patient notices only modest improvement with above noted treatment. Laboratory workup is significant for progression of hyponatremia, new onset of metabolic acidosis and elevated transaminase and elevated bilirubin. There is also been a slight drop in hemoglobin. Decision is made to admit the patient and due to progression of metabolic arrangement in spite of outpatient treatment. Case is discussed with Dr. Loleta Books of [facility] who agrees to admit the patient.  Final Clinical Impressions(s) / ED Diagnoses   Final diagnoses:  Nausea vomiting and diarrhea  Hyponatremia  Metabolic acidosis  Acute kidney injury (nontraumatic) (HCC)  Hypokalemia  Normochromic normocytic anemia  Serum total bilirubin elevated  Elevated AST (SGOT)    New Prescriptions New Prescriptions   No medications on file     Delora Fuel, MD 96/04/54 [phone]

## 2017-07-31 NOTE — Care Management Note (Signed)
Case Management Note  Patient Details  Name: Kimberly Huynh MRN: [id number] Date of Birth: November 21, 1951  Subjective/Objective:                 Paient admitted from home w spouse for N/V/D. GI Panel pending. CM will continue to follow.   Action/Plan:   Expected Discharge Date:                  Expected Discharge Plan:  Home/Self Care  In-House Referral:     Discharge planning Services  CM Consult  Post Acute Care Choice:    Choice offered to:     DME Arranged:    DME Agency:     HH Arranged:    HH Agency:     Status of Service:  In process, will continue to follow  If discussed at Long Length of Stay Meetings, dates discussed:    Additional Comments:  Carles Collet, RN 07/31/2017, 3:18 PM

## 2017-07-31 NOTE — ED Notes (Signed)
Report called to [facility]- 6

## 2017-07-31 NOTE — H&P (Signed)
[facility] History and Physical  Abuk Selleck TMA:[id number] DOB: [birth year] DOA: 07/31/2017  Referring physician: Delora Fuel, MD  PCP: Prince Solian, MD  GI: Henrene Pastor  Chief Complaint: n/v/d  HPI: Euleta Belson is a 66 y.o. female with pmh DM2, htn, hyperlipidemia, anxiety presents to ED with 1 week hx of n/v/d. Pt was seen in the ED on 8/13 with complaints of n/v and fever. Workup at that time suggestive of viral gastroenteritis. She was d/c'd home after IV fluid hydration and given rx for zofran. Pt states symptoms have persisted with onset of diarrhea approximately 3 days ago. Pt describes decrease in po intake over course of illness, but has been able to tolerate daily medications. She denies any headache, chest pain, sob, hematemesis, melena or hematochezia, no abdominal pain. She reports dry cough started 1 day ago.   ED COURSE Temp 102 vital signs o/w wnl. Na 126<--130 (8/13), K 3.2, Cr 1.29<--1.1 (8/13), WBC mildly elevated at 11.0 (ANC 8.9), lactic acid 1.28, AST 53, lipase 37, Tbili 1.4  Review of Systems:  As stated in hpi, otherwise negative   Past Medical History:  Diagnosis Date  . Allergy    seasonal  . Anxiety   . Arthritis    slight amt of arthrits in neck  . Cataract    right eye  . Clotting disorder ([place])    pt feels like she bleeds freely, feels like blood is "thin" 09-13-16  . Diabetes mellitus    Diet Controlled  . GERD (gastroesophageal reflux disease)   . Glucose intolerance (pre-diabetes)   . Heart murmur   . Hyperlipidemia   . Hypertension   . Obesity   . 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 Her smoking use included Cigarettes. She has a 8.00 pack-year smoking history. She has never used smokeless tobacco. She reports that she drinks alcohol. She reports that she does not use drugs.  Allergies  Allergen Reactions  . Amoxicillin-Pot Clavulanate Nausea And Vomiting  .  Travoprost Other (See Comments)    Red, irritated eyes    Family History  Problem Relation Age of Onset  . Stroke Father   . Diabetes Mother   . Hypertension Brother   . Hypertension Brother   . Hypertension Brother   . Hypertension Sister   . Diabetes Brother   . Colon cancer Neg Hx   . Esophageal cancer Neg Hx   . Rectal cancer Neg Hx   . Stomach cancer Neg Hx      Prior to Admission medications   Medication Sig Start Date End Date Taking? Authorizing Provider  ALPRAZolam Duanne Moron) 0.5 MG tablet Take 0.25 mg by mouth at bedtime as needed for anxiety or sleep.    Yes [provider]  aspirin 81 MG tablet Take 81 mg by mouth daily.     Yes [provider]  atenolol (TENORMIN) 25 MG tablet Take 25 mg by mouth daily.   Yes [provider]  atorvastatin (LIPITOR) 20 MG tablet Take 20 mg by mouth daily.   Yes [provider]  Cholecalciferol 2000 units CAPS Take 2,000 Units by mouth daily.   Yes [provider]  Choline Fenofibrate 135 MG capsule Take 1 capsule (135 mg total) by mouth daily. 08/10/11  Yes Burtis Junes, NP  ezetimibe (ZETIA) 10 MG tablet Take 1 tablet (10 mg total) by mouth daily. 01/19/11 07/31/17 Yes Romeo Apple, MD  ezetimibe (ZETIA) 10 MG tablet Take 10 mg by mouth every evening.    Yes [provider]  famotidine (PEPCID AC) 10 MG chewable tablet Chew 10 mg by mouth 2 (two) times daily.   Yes [provider]  fluticasone (FLONASE) 50 MCG/ACT nasal spray Place 2 sprays into both nostrils daily. 07/05/17  Yes [provider]  levocetirizine (XYZAL) 5 MG tablet Take 5 mg by mouth every evening.     Yes [provider]  olmesartan-hydrochlorothiazide (BENICAR HCT) 20-12.5 MG tablet Take 1 tablet by mouth daily. 07/04/17  Yes [provider]  ondansetron (ZOFRAN ODT) 4 MG disintegrating tablet Take 1 tablet (4 mg total) by mouth every 8 (eight) hours as needed for nausea or  vomiting. 07/29/17 08/01/17 Yes Cardama, Grayce Sessions, MD  SitaGLIPtin-MetFORMIN HCl (JANUMET XR) [phone] MG TB24 Take 1 tablet by mouth every evening.  06/13/16  Yes [provider]  sodium chloride (OCEAN) 0.65 % SOLN nasal spray Place 1 spray into both nostrils as needed for congestion.   Yes [provider]  Timolol Hemihydrate (BETIMOL OP) Apply 1 drop to eye 2 (two) times daily. Both eyes   Yes [provider]  ONE TOUCH ULTRA TEST test strip  07/03/11   [provider]  valsartan-hydrochlorothiazide (DIOVAN-HCT) 80-12.5 MG per tablet Take 1 tablet by mouth daily. Patient not taking: Reported on 07/29/2017 12/24/12   Minus Breeding, MD   Physical Exam: Vitals:   07/31/17 0500 07/31/17 0600 07/31/17 0630 07/31/17 0732  BP: 127/73 103/60 110/62 102/62  Pulse: 77 73 75 75  Resp:    18  Temp:    (!) 102 F (38.9 C)  TempSrc:    Oral  SpO2: 95% 92% 94% 94%  Weight:      Height:        Wt Readings from Last 3 Encounters:  07/31/17 64.9 kg (143 lb)  10/12/16 64.9 kg (143 lb)  09/13/16 65 kg (143 lb 6.4 oz)    General: Appears calm and comfortable, warm to touch Eyes: EOMI, normal lids, iris ENT: mucous membranes dry, no oropharyngeal lesions noted Neck: no LAD, masses or thyromegaly Cardiovascular: RRR, no m/r/g. No LE edema.  Respiratory: CTA bilaterally, no w/r/r. Normal respiratory effort. Abdomen: soft, ntnd, BS+, negative Murphy's sign Skin: no rash or induration seen on limited exam Musculoskeletal: grossly normal tone BUE/BLE Psychiatric:  grossly normal mood and affect, speech fluent and appropriate Neurologic: CN 2-12 grossly intact          Labs on Admission:  Basic Metabolic Panel:  Recent Labs Lab 07/29/17 1000 07/31/17 0454  NA 130* 126*  K 3.5 3.2*  CL 93* 90*  CO2 24 18*  GLUCOSE 175* 143*  BUN 24* 29*  CREATININE 1.10* 1.29*  CALCIUM 9.7 9.4   Liver Function Tests:  Recent Labs Lab 07/29/17 1000  07/31/17 0454  AST 36 53*  ALT 33 51  ALKPHOS 37* 34*  BILITOT 0.9 1.4*  PROT 7.9 6.9  ALBUMIN 4.0 3.2*    Recent Labs Lab 07/29/17 1000 07/31/17 0454  LIPASE 33 37   No results for input(s): AMMONIA in the last 168 hours. CBC:  Recent Labs Lab 07/29/17 1000 07/31/17 0454  WBC 10.5 11.0*  NEUTROABS  --  8.9*  HGB 12.6 11.5*  HCT 35.4* 33.7*  MCV 83.9 84.7  PLT 263 261   Cardiac Enzymes: No results for input(s): CKTOTAL, CKMB, CKMBINDEX, TROPONINI in the last 168 hours.  BNP (last 3 results)  No results for input(s): BNP in the last 8760 hours.  ProBNP (last 3 results) No results for input(s): PROBNP in the last 8760 hours.   Serum creatinine: 1.29 mg/dL (H) 07/31/17 0454 Estimated creatinine clearance: 36.9 mL/min (A)  CBG: No results for input(s): GLUCAP in the last 168 hours.  Radiological Exams on Admission: No results found.   Assessment/Plan Active Problems:   Hypertension   Hyperlipidemia   Anxiety   Nausea & vomiting   Vomiting and diarrhea   Type 2 diabetes mellitus (HCC)   Fever   Fever, N/V/D -suspect viral gastroenteritis. Will admit for supportive tx -non toxic appearing, lactic acid wnl. Abdominal exam is benign -check acute abdominal series, fecal lactoferrin -blood cx x 2, urine cx  Dehydration with mild hyponatremia -IVF hydration -recheck in am  Acute kidney injury secondary to above -Cr 1.29 <--1.1 (8/13) -hold renal toxic meds  Hypokalemia, mild -replete with IVFs -recheck in am  DM2 -hold oral meds while inpt -SSI insulin  Hx htn -soft BP on admit -hold ARB/diurectic combo, holding parameters on BB   GERD -IV PPI    Code Status: full code DVT Prophylaxis: SQ Lovenox Family Communication: Husband at bedside at time of exam Disposition Plan: Pending Improvement    Dionne Milo, NP [phone] [facility] [email or web address] Password [email or web address]

## 2017-07-31 NOTE — ED Triage Notes (Signed)
Pt c/o n/v/d for the past few days with fever was seen the past 2 days on [facility] and sent home, pt states she feels worse today.

## 2017-08-01 ENCOUNTER — Observation Stay (HOSPITAL_COMMUNITY): Payer: Medicare Other

## 2017-08-01 ENCOUNTER — Encounter (HOSPITAL_COMMUNITY): Payer: Self-pay | Admitting: *Deleted

## 2017-08-01 DIAGNOSIS — R740 Nonspecific elevation of levels of transaminase and lactic acid dehydrogenase [LDH]: Secondary | ICD-10-CM

## 2017-08-01 DIAGNOSIS — E871 Hypo-osmolality and hyponatremia: Secondary | ICD-10-CM | POA: Diagnosis present

## 2017-08-01 DIAGNOSIS — K760 Fatty (change of) liver, not elsewhere classified: Secondary | ICD-10-CM | POA: Diagnosis not present

## 2017-08-01 DIAGNOSIS — E860 Dehydration: Secondary | ICD-10-CM | POA: Diagnosis present

## 2017-08-01 DIAGNOSIS — I10 Essential (primary) hypertension: Secondary | ICD-10-CM | POA: Diagnosis present

## 2017-08-01 DIAGNOSIS — E785 Hyperlipidemia, unspecified: Secondary | ICD-10-CM | POA: Diagnosis present

## 2017-08-01 DIAGNOSIS — E7439 Other disorders of intestinal carbohydrate absorption: Secondary | ICD-10-CM | POA: Diagnosis present

## 2017-08-01 DIAGNOSIS — K529 Noninfective gastroenteritis and colitis, unspecified: Secondary | ICD-10-CM | POA: Diagnosis not present

## 2017-08-01 DIAGNOSIS — E669 Obesity, unspecified: Secondary | ICD-10-CM | POA: Diagnosis present

## 2017-08-01 DIAGNOSIS — R509 Fever, unspecified: Secondary | ICD-10-CM | POA: Diagnosis not present

## 2017-08-01 DIAGNOSIS — D649 Anemia, unspecified: Secondary | ICD-10-CM | POA: Diagnosis present

## 2017-08-01 DIAGNOSIS — E118 Type 2 diabetes mellitus with unspecified complications: Secondary | ICD-10-CM | POA: Diagnosis not present

## 2017-08-01 DIAGNOSIS — Z8249 Family history of ischemic heart disease and other diseases of the circulatory system: Secondary | ICD-10-CM | POA: Diagnosis not present

## 2017-08-01 DIAGNOSIS — Z6826 Body mass index (BMI) 26.0-26.9, adult: Secondary | ICD-10-CM | POA: Diagnosis not present

## 2017-08-01 DIAGNOSIS — Z79899 Other long term (current) drug therapy: Secondary | ICD-10-CM | POA: Diagnosis not present

## 2017-08-01 DIAGNOSIS — F419 Anxiety disorder, unspecified: Secondary | ICD-10-CM | POA: Diagnosis present

## 2017-08-01 DIAGNOSIS — E872 Acidosis: Secondary | ICD-10-CM | POA: Diagnosis present

## 2017-08-01 DIAGNOSIS — J189 Pneumonia, unspecified organism: Secondary | ICD-10-CM | POA: Diagnosis present

## 2017-08-01 DIAGNOSIS — Z833 Family history of diabetes mellitus: Secondary | ICD-10-CM | POA: Diagnosis not present

## 2017-08-01 DIAGNOSIS — Z7982 Long term (current) use of aspirin: Secondary | ICD-10-CM | POA: Diagnosis not present

## 2017-08-01 DIAGNOSIS — J690 Pneumonitis due to inhalation of food and vomit: Secondary | ICD-10-CM | POA: Diagnosis present

## 2017-08-01 DIAGNOSIS — Z823 Family history of stroke: Secondary | ICD-10-CM | POA: Diagnosis not present

## 2017-08-01 DIAGNOSIS — N179 Acute kidney failure, unspecified: Secondary | ICD-10-CM | POA: Diagnosis not present

## 2017-08-01 DIAGNOSIS — J302 Other seasonal allergic rhinitis: Secondary | ICD-10-CM | POA: Diagnosis present

## 2017-08-01 DIAGNOSIS — A084 Viral intestinal infection, unspecified: Secondary | ICD-10-CM | POA: Diagnosis present

## 2017-08-01 DIAGNOSIS — E876 Hypokalemia: Secondary | ICD-10-CM | POA: Diagnosis present

## 2017-08-01 DIAGNOSIS — K8020 Calculus of gallbladder without cholecystitis without obstruction: Secondary | ICD-10-CM | POA: Diagnosis present

## 2017-08-01 DIAGNOSIS — M810 Age-related osteoporosis without current pathological fracture: Secondary | ICD-10-CM | POA: Diagnosis present

## 2017-08-01 DIAGNOSIS — K219 Gastro-esophageal reflux disease without esophagitis: Secondary | ICD-10-CM | POA: Diagnosis present

## 2017-08-01 LAB — URINALYSIS, ROUTINE W REFLEX MICROSCOPIC
Bilirubin Urine: NEGATIVE
Glucose, UA: NEGATIVE mg/dL
HGB URINE DIPSTICK: NEGATIVE
Ketones, ur: 5 mg/dL — AB
Leukocytes, UA: NEGATIVE
NITRITE: NEGATIVE
Protein, ur: 30 mg/dL — AB
SPECIFIC GRAVITY, URINE: 1.021 (ref 1.005–1.030)
pH: 6 (ref 5.0–8.0)

## 2017-08-01 LAB — C DIFFICILE QUICK SCREEN W PCR REFLEX
C DIFFICILE (CDIFF) INTERP: NOT DETECTED
C DIFFICILE (CDIFF) TOXIN: NEGATIVE
C DIFFICLE (CDIFF) ANTIGEN: NEGATIVE

## 2017-08-01 LAB — GLUCOSE, CAPILLARY
GLUCOSE-CAPILLARY: 120 mg/dL — AB (ref 65–99)
GLUCOSE-CAPILLARY: 115 mg/dL — AB (ref 65–99)
Glucose-Capillary: 105 mg/dL — ABNORMAL HIGH (ref 65–99)
Glucose-Capillary: 109 mg/dL — ABNORMAL HIGH (ref 65–99)
Glucose-Capillary: 132 mg/dL — ABNORMAL HIGH (ref 65–99)

## 2017-08-01 LAB — GASTROINTESTINAL PANEL BY PCR, STOOL (REPLACES STOOL CULTURE)

## 2017-08-01 LAB — HEMOGLOBIN A1C
HEMOGLOBIN A1C: 6.3 % — AB (ref 4.8–5.6)
MEAN PLASMA GLUCOSE: 134 mg/dL

## 2017-08-01 LAB — COMPREHENSIVE METABOLIC PANEL
ALBUMIN: 2.4 g/dL — AB (ref 3.5–5.0)
ALT: 58 U/L — ABNORMAL HIGH (ref 14–54)
ANION GAP: 8 (ref 5–15)
AST: 64 U/L — ABNORMAL HIGH (ref 15–41)
Alkaline Phosphatase: 33 U/L — ABNORMAL LOW (ref 38–126)
BILIRUBIN TOTAL: 0.7 mg/dL (ref 0.3–1.2)
BUN: 18 mg/dL (ref 6–20)
CO2: 21 mmol/L — ABNORMAL LOW (ref 22–32)
Calcium: 8 mg/dL — ABNORMAL LOW (ref 8.9–10.3)
Chloride: 104 mmol/L (ref 101–111)
Creatinine, Ser: 0.79 mg/dL (ref 0.44–1.00)
GLUCOSE: 118 mg/dL — AB (ref 65–99)
POTASSIUM: 3.5 mmol/L (ref 3.5–5.1)
SODIUM: 133 mmol/L — AB (ref 135–145)
TOTAL PROTEIN: 5.2 g/dL — AB (ref 6.5–8.1)

## 2017-08-01 LAB — CBC
HEMATOCRIT: 28.1 % — AB (ref 36.0–46.0)
HEMOGLOBIN: 9.6 g/dL — AB (ref 12.0–15.0)
MCH: 28.7 pg (ref 26.0–34.0)
MCHC: 34.2 g/dL (ref 30.0–36.0)
MCV: 84.1 fL (ref 78.0–100.0)
Platelets: 237 10*3/uL (ref 150–400)
RBC: 3.34 MIL/uL — AB (ref 3.87–5.11)
RDW: 14 % (ref 11.5–15.5)
WBC: 8.3 10*3/uL (ref 4.0–10.5)

## 2017-08-01 LAB — STREP PNEUMONIAE URINARY ANTIGEN: Strep Pneumo Urinary Antigen: NEGATIVE

## 2017-08-01 LAB — HIV ANTIBODY (ROUTINE TESTING W REFLEX): HIV Screen 4th Generation wRfx: NONREACTIVE

## 2017-08-01 LAB — LACTOFERRIN, FECAL, QUALITATIVE: LACTOFERRIN, FECAL, QUAL: NEGATIVE

## 2017-08-01 MED ORDER — BENZONATATE 100 MG PO CAPS
100.0000 mg | ORAL_CAPSULE | Freq: Three times a day (TID) | ORAL | Status: DC
  Administered 2017-08-01 – 2017-08-03 (×7): 100 mg via ORAL
  Filled 2017-08-01 (×7): qty 1

## 2017-08-01 MED ORDER — GUAIFENESIN-CODEINE 100-10 MG/5ML PO SOLN
5.0000 mL | Freq: Four times a day (QID) | ORAL | Status: DC | PRN
  Administered 2017-08-01 – 2017-08-02 (×5): 5 mL via ORAL
  Filled 2017-08-01 (×5): qty 5

## 2017-08-01 MED ORDER — HYDROCOD POLST-CPM POLST ER 10-8 MG/5ML PO SUER
5.0000 mL | Freq: Once | ORAL | Status: AC
  Administered 2017-08-01: 5 mL via ORAL
  Filled 2017-08-01: qty 5

## 2017-08-01 MED ORDER — METRONIDAZOLE IN NACL 5-0.79 MG/ML-% IV SOLN
500.0000 mg | Freq: Three times a day (TID) | INTRAVENOUS | Status: DC
  Administered 2017-08-01 – 2017-08-03 (×6): 500 mg via INTRAVENOUS
  Filled 2017-08-01 (×6): qty 100

## 2017-08-01 MED ORDER — LOPERAMIDE HCL 2 MG PO CAPS
4.0000 mg | ORAL_CAPSULE | Freq: Once | ORAL | Status: AC
Start: 2017-08-01 — End: 2017-08-01
  Administered 2017-08-01: 4 mg via ORAL
  Filled 2017-08-01: qty 2

## 2017-08-01 MED ORDER — LOPERAMIDE HCL 2 MG PO CAPS: 2.0000 mg | ORAL_CAPSULE | ORAL | Status: DC | PRN

## 2017-08-01 MED ORDER — WHITE PETROLATUM GEL
Status: AC
  Administered 2017-08-01: 10:00:00
  Filled 2017-08-01: qty 1

## 2017-08-01 NOTE — Progress Notes (Signed)
[facility]                                                                              Patient Demographics  Kimberly Huynh, is a 66 y.o. female, DOB - [birth year], ASN:[id number]  Admit date - 07/31/2017   Admitting Physician Waldemar Dickens, MD  Outpatient Primary MD for the patient is Avva, Steva Ready, MD  Outpatient specialists:   LOS - 0  days   Medical records reviewed and are as summarized below:    Chief Complaint  Patient presents with  . Emesis  . Diarrhea       Brief summary   Patient is a 10 -year-old female with diabetes, hypertension, hyperlipidemia, anxiety presented with one-week history of nausea vomiting, diarrhea. Patient was seen in ED on 8/13 with nausea vomiting and fever. Workup at that time suggested a viral gastroenteritis. Patient was discharged home with IV fluid hydration and was given a prescription for Zofran. Patient reported that her symptoms persisted with the onset of diarrhea. She describes decrease in oral intake over the course of last period   Assessment & Plan    Principal Problem:   Acute gastroenteritis - Per patient diarrhea still not improving, vomiting is somewhat better however still feels nauseous but feels that she may be able to handle clears -will place on clear liquid diet, continue IV fluid hydration - Continue antiemetics - CT abdomen done, no colitis. Gallstone however no acute cholecystitis - C. difficile negative, placed on Imodium - GI pathogen panel pending - Blood cultures negative - Antibiotics changed to IV Rocephin and Flagyl  Active Problems:  Right lung pneumonia: Possibly due to aspiration - Continue IV Rocephin and Flagyl - Right lung rhonchorous, EKG done, QTC 430, if no significant improvement, will change to IV Levaquin  Dehydration with mild hyponatremia - Improving, continue IV fluid hydration - Sodium 126 at the time of admission, improving  Acute kidney injury - Improving,  creatinine 1.29 at the time of admission - Hold olmesartan, HCTZ     Hypertension - Hold olmesartan, HCTZ, continue IV fluid hydration       Type 2 diabetes mellitus (HCC) -  hold oral meds while inpatient, continue sliding scale insulin   Code Status: full  DVT Prophylaxis:  Lovenox  Family Communication: Discussed in detail with the patient, all imaging results, lab results explained to the patient and husband    Disposition Plan:   Time Spent in minutes  25 minutes  Procedures:    Consultants:    Antimicrobials:   IV Rocephin 8/15  IV Flagyl 8/16   Medications  Scheduled Meds: . aspirin  81 mg Oral Daily  . atenolol  25 mg Oral Daily  . atorvastatin  20 mg Oral Daily  . benzonatate  100 mg Oral TID  . enoxaparin (LOVENOX) injection  40 mg Subcutaneous Q24H  . fluticasone  2 spray Each Nare Daily  . insulin aspart  0-9 Units Subcutaneous TID WC  . loperamide  4 mg Oral Once  . pantoprazole (PROTONIX) IV  40 mg Intravenous Q24H  . timolol  1 drop Both Eyes BID  Continuous Infusions: . 0.9 % NaCl with KCl 20 mEq / L 125 mL/hr at 08/01/17 0058  . cefTRIAXone (ROCEPHIN)  IV Stopped (07/31/17 1754)  . metronidazole 500 mg (08/01/17 1131)   PRN Meds:.acetaminophen **OR** acetaminophen, ALPRAZolam, guaiFENesin-codeine, loperamide, ondansetron **OR** ondansetron (ZOFRAN) IV   Antibiotics   Anti-infectives    Start     Dose/Rate Route Frequency Ordered Stop   08/01/17 1000  metroNIDAZOLE (FLAGYL) IVPB 500 mg     500 mg 100 mL/hr over 60 Minutes Intravenous Every 8 hours 08/01/17 0916     07/31/17 1500  cefTRIAXone (ROCEPHIN) 1 g in dextrose 5 % 50 mL IVPB     1 g 100 mL/hr over 30 Minutes Intravenous Every 24 hours 07/31/17 1355 08/07/17 1459   07/31/17 1500  azithromycin (ZITHROMAX) 500 mg in dextrose 5 % 250 mL IVPB  Status:  Discontinued     500 mg 250 mL/hr over 60 Minutes Intravenous Every 24 hours 07/31/17 1355 08/01/17 0916        Subjective:    Kimberly Huynh was seen and examined today.  Feeling miserable, states diarrhea is still on. No significant vomiting but he'll still feels nauseous. Has not had much to eat for last 1 week. Feels very weak. Patient denies dizziness, chest pain, shortness of breath, abdominal pain. Low-grade fevers overnight.    Objective:   Vitals:   07/31/17 1900 07/31/17 2113 08/01/17 0421 08/01/17 0804  BP: (!) 105/55 118/62 114/65 122/72  Pulse: 70 84 73 71  Resp: 18 16 17 17   Temp: 98.4 F (36.9 C) 100.3 F (37.9 C) 100.1 F (37.8 C) 98.6 F (37 C)  TempSrc: Oral   Oral  SpO2: 98% 93% 94% 92%  Weight:  65.8 kg (145 lb)    Height:        Intake/Output Summary (Last 24 hours) at 08/01/17 1255 Last data filed at 08/01/17 0422  Gross per 24 hour  Intake             2200 ml  Output                2 ml  Net             2198 ml     Wt Readings from Last 3 Encounters:  07/31/17 65.8 kg (145 lb)  10/12/16 64.9 kg (143 lb)  09/13/16 65 kg (143 lb 6.4 oz)     Exam  General: Alert and oriented x 3, NAD  Eyes: PERRLA, EOMI, Anicteric Sclera,  HEENT:  Atraumatic, normocephalic, normal oropharynx  Cardiovascular: S1 S2 auscultated, no rubs, murmurs or gallops. Regular rate and rhythm.  Respiratory: Clear to auscultation bilaterally, no wheezing, rales or rhonchi  Gastrointestinal: Soft, nontender, nondistended, + bowel sounds  Ext: no pedal edema bilaterally  Neuro: AAOx3, Cr N's II- XII. Strength 5/5 upper and lower extremities bilaterally, speech clear, sensations grossly intact  Musculoskeletal: No digital cyanosis, clubbing  Skin: No rashes  Psych: Normal affect and demeanor, alert and oriented x3    Data Reviewed:  I have personally reviewed following labs and imaging studies  Micro Results Recent Results (from the past 240 hour(s))  Culture, blood (routine x 2)     Status: None (Preliminary result)   Collection Time: 07/31/17  5:02 AM  Result Value Ref Range Status    Specimen Description BLOOD RIGHT ANTECUBITAL  Final   Special Requests   Final    BOTTLES DRAWN AEROBIC AND ANAEROBIC Blood Culture adequate volume  Culture NO GROWTH 1 DAY  Final   Report Status PENDING  Incomplete  Culture, blood (routine x 2)     Status: None (Preliminary result)   Collection Time: 07/31/17  5:07 AM  Result Value Ref Range Status   Specimen Description BLOOD LEFT ANTECUBITAL  Final   Special Requests   Final    BOTTLES DRAWN AEROBIC AND ANAEROBIC Blood Culture adequate volume   Culture NO GROWTH 1 DAY  Final   Report Status PENDING  Incomplete  C difficile quick scan w PCR reflex     Status: None   Collection Time: 07/31/17 11:47 PM  Result Value Ref Range Status   C Diff antigen NEGATIVE NEGATIVE Final   C Diff toxin NEGATIVE NEGATIVE Final   C Diff interpretation No C. difficile detected.  Final    Radiology Reports Ct Abdomen Pelvis Wo Contrast  Result Date: 08/01/2017 CLINICAL DATA:  Abdominal pain, nausea and vomiting for 1 week, some diarrhea EXAM: CT ABDOMEN AND PELVIS WITHOUT CONTRAST TECHNIQUE: Multidetector CT imaging of the abdomen and pelvis was performed following the standard protocol without IV contrast. COMPARISON:  Ultrasound the abdomen of 07/31/2017 FINDINGS: Lower chest: There is extensive parenchymal opacity within the right lower lobe with air bronchograms most consistent with a right lower lobe pneumonia. Small pleural effusions also were present. The heart is within normal limits in size. Hepatobiliary: The liver is low in attenuation consistent with diffuse fatty infiltration. A gallstone is noted in the neck of the gallbladder measuring 10 mm in diameter. No gallbladder distention or gallbladder wall thickening is seen. Pancreas: The pancreas is normal in size and the pancreatic duct is not dilated. Spleen: The spleen is unremarkable. Adrenals/Urinary Tract: The adrenal glands appear normal. No renal calculi are seen. There is no evidence of  hydronephrosis. The ureters are normal in caliber. The urinary bladder is decompressed and cannot be evaluated. Stomach/Bowel: The stomach is largely decompressed. No small bowel distention is seen the colon is decompressed. The terminal ileum is unremarkable. The appendix is not definitely visualized. No inflammatory process is seen within the right lower quadrant. Vascular/Lymphatic: The abdominal aorta is normal in caliber with moderate abdominal aortic atherosclerosis present. No adenopathy is seen. Reproductive: The uterus is normal in size. No adnexal lesion is seen. No fluid is noted within the pelvis. Other: None. Musculoskeletal: The lumbar vertebrae are in normal alignment. Intervertebral disc spaces appear normal. The SI joints are corticated. IMPRESSION: 1. Right lower lobe pneumonia.  Small bilateral pleural effusions. 2. 10 mm gallstone in the neck of the gallbladder. No present CT evidence of acute cholecystitis is seen. Correlate clinically. 3. Diffuse fatty infiltration of the liver. 4. Moderate abdominal aortic atherosclerosis. Electronically Signed   By: Ivar Drape M.D.   On: 08/01/2017 11:28   Dg Chest 2 View  Result Date: 07/31/2017 CLINICAL DATA:  Fever since Saturday. EXAM: CHEST  2 VIEW COMPARISON:  None FINDINGS: Right lower lobe and right middle lobe hazy airspace disease concerning for pneumonia. No other focal parenchymal opacity. No pleural effusion or pneumothorax. Normal cardiomediastinal silhouette. Thoracic aortic atherosclerosis. No acute osseous abnormality. IMPRESSION: Right lower lobe and right middle lobe hazy airspace disease most concerning for pneumonia. Followup PA and lateral chest X-ray is recommended in 3-4 weeks following trial of antibiotic therapy to ensure resolution and exclude underlying malignancy. Electronically Signed   By: Kathreen Devoid   On: 07/31/2017 13:36   Acute Abdominal Series  Result Date: 07/31/2017 CLINICAL DATA:  Nausea  and vomiting for 3  days, history hypertension, diabetes mellitus, GERD, former smoker EXAM: DG ABDOMEN ACUTE W/ 1V CHEST COMPARISON:  None FINDINGS: Normal heart size, mediastinal contours, and pulmonary vascularity. RIGHT basilar infiltrate likely representing RIGHT lower lobe pneumonia. Question minimal LEFT basilar infiltrate as well. No pleural effusion or pneumothorax. Atherosclerotic calcification aorta. Rounded lamellated calcification RIGHT upper quadrant 10 mm diameter compatible with gallstone. Nonobstructive bowel gas pattern. No bowel dilatation or bowel wall thickening or free air. Scattered pelvic phleboliths. No definite urinary tract calcification. Bones demineralized. IMPRESSION: Bibasilar infiltrates greater on RIGHT consistent with lower lobe pneumonia. Cholelithiasis. Nonspecific bowel gas pattern. Electronically Signed   By: Lavonia Dana M.D.   On: 07/31/2017 08:51   US Abdomen Limited Ruq  Result Date: 07/31/2017 CLINICAL DATA:  Nausea, vomiting, diarrhea. EXAM: ULTRASOUND ABDOMEN LIMITED RIGHT UPPER QUADRANT COMPARISON:  None. FINDINGS: Gallbladder: 9 mm gallstone is noted. No gallbladder wall thickening or pericholecystic fluid is noted. No sonographic Murphy's sign is noted. Common bile duct: Diameter: 4.2 mm which is within normal limits. Liver: No focal lesion identified. Increased echogenicity of hepatic parenchyma is noted. IMPRESSION: Solitary gallstone without evidence of cholecystitis. Probable fatty infiltration of the liver. Electronically Signed   By: Marijo Conception, M.D.   On: 07/31/2017 13:05    Lab Data:  CBC:  Recent Labs Lab 07/29/17 1000 07/31/17 0454 08/01/17 0228  WBC 10.5 11.0* 8.3  NEUTROABS  --  8.9*  --   HGB 12.6 11.5* 9.6*  HCT 35.4* 33.7* 28.1*  MCV 83.9 84.7 84.1  PLT 263 261 161   Basic Metabolic Panel:  Recent Labs Lab 07/29/17 1000 07/31/17 0454 08/01/17 0228  NA 130* 126* 133*  K 3.5 3.2* 3.5  CL 93* 90* 104  CO2 24 18* 21*  GLUCOSE 175* 143* 118*   BUN 24* 29* 18  CREATININE 1.10* 1.29* 0.79  CALCIUM 9.7 9.4 8.0*   GFR: Estimated Creatinine Clearance: 59.9 mL/min (by C-G formula based on SCr of 0.79 mg/dL). Liver Function Tests:  Recent Labs Lab 07/29/17 1000 07/31/17 0454 08/01/17 0228  AST 36 53* 64*  ALT 33 51 58*  ALKPHOS 37* 34* 33*  BILITOT 0.9 1.4* 0.7  PROT 7.9 6.9 5.2*  ALBUMIN 4.0 3.2* 2.4*    Recent Labs Lab 07/29/17 1000 07/31/17 0454  LIPASE 33 37   No results for input(s): AMMONIA in the last 168 hours. Coagulation Profile: No results for input(s): INR, PROTIME in the last 168 hours. Cardiac Enzymes: No results for input(s): CKTOTAL, CKMB, CKMBINDEX, TROPONINI in the last 168 hours. BNP (last 3 results) No results for input(s): PROBNP in the last 8760 hours. HbA1C:  Recent Labs  07/31/17 0805  HGBA1C 6.3*   CBG:  Recent Labs Lab 07/31/17 2053 08/01/17 0003 08/01/17 0406 08/01/17 0802 08/01/17 1138  GLUCAP 125* 138* 120* 115* 105*   Lipid Profile: No results for input(s): CHOL, HDL, LDLCALC, TRIG, CHOLHDL, LDLDIRECT in the last 72 hours. Thyroid Function Tests: No results for input(s): TSH, T4TOTAL, FREET4, T3FREE, THYROIDAB in the last 72 hours. Anemia Panel: No results for input(s): VITAMINB12, FOLATE, FERRITIN, TIBC, IRON, RETICCTPCT in the last 72 hours. Urine analysis: No results found for: COLORURINE, APPEARANCEUR, LABSPEC, PHURINE, GLUCOSEU, HGBUR, BILIRUBINUR, KETONESUR, PROTEINUR, UROBILINOGEN, NITRITE, LEUKOCYTESUR   Ripudeep Rai M.D. [facility] 08/01/2017, 12:55 PM  Pager: [phone] Between 7am to 7pm - call Pager - [phone]  After 7pm go to [email or web address] - password [email or web address]  Call night coverage person covering after 7pm

## 2017-08-01 NOTE — Progress Notes (Signed)
Pt c/o of cont cough in between tessalon and requested for another cough medicine. On call was texted for the said request. Awaiting for the response.

## 2017-08-02 LAB — CBC
HEMATOCRIT: 30.6 % — AB (ref 36.0–46.0)
HEMOGLOBIN: 10.2 g/dL — AB (ref 12.0–15.0)
MCH: 28.4 pg (ref 26.0–34.0)
MCHC: 33.3 g/dL (ref 30.0–36.0)
MCV: 85.2 fL (ref 78.0–100.0)
Platelets: 307 10*3/uL (ref 150–400)
RBC: 3.59 MIL/uL — AB (ref 3.87–5.11)
RDW: 14.5 % (ref 11.5–15.5)
WBC: 6.9 10*3/uL (ref 4.0–10.5)

## 2017-08-02 LAB — URINE CULTURE: CULTURE: NO GROWTH

## 2017-08-02 LAB — BASIC METABOLIC PANEL
Anion gap: 9 (ref 5–15)
BUN: 11 mg/dL (ref 6–20)
CHLORIDE: 104 mmol/L (ref 101–111)
CO2: 21 mmol/L — AB (ref 22–32)
Calcium: 8.4 mg/dL — ABNORMAL LOW (ref 8.9–10.3)
Creatinine, Ser: 0.67 mg/dL (ref 0.44–1.00)
GFR calc non Af Amer: >60 mL/min (ref >60)
Glucose, Bld: 115 mg/dL — ABNORMAL HIGH (ref 65–99)
POTASSIUM: 3.5 mmol/L (ref 3.5–5.1)
SODIUM: 134 mmol/L — AB (ref 135–145)

## 2017-08-02 LAB — GLUCOSE, CAPILLARY
GLUCOSE-CAPILLARY: 106 mg/dL — AB (ref 65–99)
GLUCOSE-CAPILLARY: 151 mg/dL — AB (ref 65–99)
GLUCOSE-CAPILLARY: 143 mg/dL — AB (ref 65–99)
Glucose-Capillary: 148 mg/dL — ABNORMAL HIGH (ref 65–99)

## 2017-08-02 MED ORDER — LOPERAMIDE HCL 2 MG PO CAPS
4.0000 mg | ORAL_CAPSULE | Freq: Once | ORAL | Status: AC
  Administered 2017-08-02: 4 mg via ORAL
  Filled 2017-08-02: qty 2

## 2017-08-02 NOTE — Progress Notes (Signed)
Report given to Rebecca,RN at the bedside.

## 2017-08-02 NOTE — Progress Notes (Signed)
Pt adm guaifenasin with codein for cough.

## 2017-08-02 NOTE — Evaluation (Signed)
Physical Therapy Evaluation Patient Details Name: Kimberly Huynh MRN: [id number] DOB: 1951/01/20 Today's Date: 08/02/2017   History of Present Illness  Pt is a 66 yo female admitted through ED on 07/31/17 following a week long hisotry of nausea, vomiting and diarrhea. Pt was diagnosed with viral gastroenteritis, dehydration, AKI, hypokalemia. PMH significant for DM2, HTN, HLD, anxiety, GERD.   Clinical Impression  Pt presents with the above diagnosis and below deficits for therapy evaluation. Prior to admission and recent illness, pt was completely independent and very active. Pt was walking around her neighborhood and hiking with her sister. Currently, pt requires Supervision for safety with mobility. Pt will benefit from continued acute PT follow-up to assess gait without RW and stair negotiation prior to D/C.     Follow Up Recommendations Home health PT    Equipment Recommendations  None recommended by PT    Recommendations for Other Services       Precautions / Restrictions Precautions Precautions: Fall Restrictions Weight Bearing Restrictions: No      Mobility  Bed Mobility               General bed mobility comments: Up in recliner when PT arrives  Transfers Overall transfer level: Needs assistance Equipment used: Rolling walker (2 wheeled) Transfers: Sit to/from Stand Sit to Stand: Supervision         General transfer comment: Supervision for safety from recliner  Ambulation/Gait Ambulation/Gait assistance: Supervision Ambulation Distance (Feet): 500 Feet Assistive device: Rolling walker (2 wheeled) Gait Pattern/deviations: Step-through pattern Gait velocity: decreased Gait velocity interpretation: Below normal speed for age/gender General Gait Details: good sequencing and positioning within RW, no LOB. Minimal fatigue noted following.   Stairs            Wheelchair Mobility    Modified Rankin (Stroke Patients Only)       Balance Overall  balance assessment: No apparent balance deficits (not formally assessed)                                           Pertinent Vitals/Pain Pain Assessment: No/denies pain    Home Living Family/patient expects to be discharged to:: Private residence Living Arrangements: Spouse/significant other Available Help at Discharge: Family;Available 24 hours/day Type of Home: House Home Access: Stairs to enter Entrance Stairs-Rails: Right;Can reach Software engineer of Steps: 5 Home Layout: Two level;Bed/bath upstairs;1/2 bath on main level Home Equipment: None      Prior Function Level of Independence: Independent         Comments: completely independent, very activie, driving      Hand Dominance   Dominant Hand: Right    Extremity/Trunk Assessment   Upper Extremity Assessment Upper Extremity Assessment: Overall WFL for tasks assessed    Lower Extremity Assessment Lower Extremity Assessment: Generalized weakness    Cervical / Trunk Assessment Cervical / Trunk Assessment: Normal  Communication   Communication: No difficulties  Cognition Arousal/Alertness: Awake/alert Behavior During Therapy: WFL for tasks assessed/performed Overall Cognitive Status: Within Functional Limits for tasks assessed                                        General Comments      Exercises     Assessment/Plan    PT Assessment Patient needs continued PT services  PT Problem List Decreased activity tolerance;Decreased mobility;Decreased knowledge of use of DME       PT Treatment Interventions DME instruction;Gait training;Stair training;Functional mobility training;Therapeutic activities;Therapeutic exercise    PT Goals (Current goals can be found in the Care Plan section)  Acute Rehab PT Goals Patient Stated Goal: to get home tomorrow PT Goal Formulation: With patient Time For Goal Achievement: 08/09/17 Potential to Achieve Goals:  Good    Frequency Min 3X/week   Barriers to discharge        Co-evaluation               AM-PAC PT "6 Clicks" Daily Activity  Outcome Measure Difficulty turning over in bed (including adjusting bedclothes, sheets and blankets)?: None Difficulty moving from lying on back to sitting on the side of the bed? : None Difficulty sitting down on and standing up from a chair with arms (e.g., wheelchair, bedside commode, etc,.)?: A Little Help needed moving to and from a bed to chair (including a wheelchair)?: A Little Help needed walking in hospital room?: A Little Help needed climbing 3-5 steps with a railing? : A Little 6 Click Score: 20    End of Session Equipment Utilized During Treatment: Gait belt Activity Tolerance: Patient tolerated treatment well Patient left: in chair;with call bell/phone within reach;with family/visitor present Nurse Communication: Mobility status PT Visit Diagnosis: Difficulty in walking, not elsewhere classified (R26.2);Muscle weakness (generalized) (M62.81)    Time: 5409-8119 PT Time Calculation (min) (ACUTE ONLY): 25 min   Charges:   PT Evaluation $PT Eval Moderate Complexity: 1 Mod PT Treatments $Gait Training: 8-22 mins   PT G Codes:        Scheryl Marten PT, DPT  [phone]   Shanon Rosser 08/02/2017, 1:17 PM

## 2017-08-02 NOTE — Consult Note (Signed)
[facility] CM Primary Care Navigator  08/02/2017  Kimberly Huynh [birth year] [id number]    Met with patient and husband ([name])at the bedside to identify possible discharge needs.  Patient reports having nausea, vomiting, diarrhea, weakness and fever that had led to this admission.   Patient confirmed that primary care provider isDr.Ravisankar Avva with [facility].  Patient shared using [place] [facility] to obtain medications without difficulty.   Patientmanages her ownmedications at home using "pill box" system filled weekly.  She reports being able to drive prior to admission, however, husband will provide transportation to herdoctors'appointments if needed after discharge.  Patient's husband will be her primary caregiver at home as stated.   Anticipated discharge plan is home with home health services per PT recommendation.  Patient voiced understanding to call primary care provider's officewhen shereturns home,for a post discharge follow-up appointment within a week or sooner if needed.Patient letter (with PCP's contact number) was provided as a reminder.   Explained to patient and daughter regarding [facility] CM services available for health management. Patient reports managing DM at home with diet, medication, exercise and follow-up with primary care provided when needed. Patient had verbally agreed and opted for EMMI calls to help follow-up with recovery at home.  Referral was made for [facility] calls after discharge.  Patient voiced understanding to seek referral to [facility] care managementfrom primary care provider if deemed necessary for servicesin the future.   [facility] care management information provided for future needs that may arise.   For questions, please contact:  Dannielle Huh, BSN, RN- [facility] Primary Care Navigator  Telephone: [phone] [place]

## 2017-08-02 NOTE — Progress Notes (Signed)
[facility]                                                                              Patient Demographics  Kimberly Huynh, is a 66 y.o. female, DOB - 1951/11/27, [id number]  Admit date - 07/31/2017   Admitting Physician Waldemar Dickens, MD  Outpatient Primary MD for the patient is Avva, Steva Ready, MD  Outpatient specialists:   LOS - 1  days   Medical records reviewed and are as summarized below:    Chief Complaint  Patient presents with  . Emesis  . Diarrhea       Brief summary   Patient is a 66 -year-old female with diabetes, hypertension, hyperlipidemia, anxiety presented with one-week history of nausea vomiting, diarrhea. Patient was seen in ED on 8/13 with nausea vomiting and fever. Workup at that time suggested a viral gastroenteritis. Patient was discharged home with IV fluid hydration and was given a prescription for Zofran. Patient reported that her symptoms persisted with the onset of diarrhea. She describes decrease in oral intake over the course of last period   Assessment & Plan    Principal Problem:   Acute gastroenteritis - Nausea vomiting improved, advance to soft diet - CT abdomen and pelvis obtained, no colitis, Gallstone however no acute cholecystitis. - C. difficile negative. still had 1 episode of diarrhea this morning, give Imodium 4 mg 1 - CT abdomen done, no colitis. Gallstone however no acute cholecystitis - C. difficile negative, placed on Imodium  - GI pathogen panel negative - Blood cultures negative   Active Problems:  Right lung pneumonia: Possibly due to aspiration - Continue IV Rocephin and Flagyl - Improving clinically  Dehydration with mild hyponatremia - Sodium 126 at the time of admission - continue gentle hydration - Improving sodium 134 today  Acute kidney injury - Improving, creatinine 1.29 at the time of admission - Hold olmesartan, HCTZ  - Continue gentle hydration today    Hypertension -  Hold olmesartan, HCT - Continue gentle hydration      Type 2 diabetes mellitus ([place]) -  hold oral meds while inpatient, continue sliding scale insulin   Code Status: full  DVT Prophylaxis:  Lovenox  Family Communication: Discussed in detail with the patient, all imaging results, lab results explained to the patient   Disposition Plan: Hopefully DC home in a.m.  Time Spent in minutes  25 minutes  Procedures:    Consultants:    Antimicrobials:   IV Rocephin 8/15  IV Flagyl 8/16   Medications  Scheduled Meds: . aspirin  81 mg Oral Daily  . atenolol  25 mg Oral Daily  . atorvastatin  20 mg Oral Daily  . benzonatate  100 mg Oral TID  . enoxaparin (LOVENOX) injection  40 mg Subcutaneous Q24H  . fluticasone  2 spray Each Nare Daily  . insulin aspart  0-9 Units Subcutaneous TID WC  . pantoprazole (PROTONIX) IV  40 mg Intravenous Q24H  . timolol  1 drop Both Eyes BID   Continuous Infusions: . 0.9 % NaCl with KCl 20 mEq / L 75 mL/hr at 08/02/17 1220  . cefTRIAXone (ROCEPHIN)  IV Stopped (08/01/17 1620)  . metronidazole Stopped (08/02/17 1131)   PRN Meds:.acetaminophen **OR** acetaminophen, ALPRAZolam, guaiFENesin-codeine, loperamide, ondansetron **OR** ondansetron (ZOFRAN) IV   Antibiotics   Anti-infectives    Start     Dose/Rate Route Frequency Ordered Stop   08/01/17 1000  metroNIDAZOLE (FLAGYL) IVPB 500 mg     500 mg 100 mL/hr over 60 Minutes Intravenous Every 8 hours 08/01/17 0916     07/31/17 1500  cefTRIAXone (ROCEPHIN) 1 g in dextrose 5 % 50 mL IVPB     1 g 100 mL/hr over 30 Minutes Intravenous Every 24 hours 07/31/17 1355 08/07/17 1459   07/31/17 1500  azithromycin (ZITHROMAX) 500 mg in dextrose 5 % 250 mL IVPB  Status:  Discontinued     500 mg 250 mL/hr over 60 Minutes Intravenous Every 24 hours 07/31/17 1355 08/01/17 0916        Subjective:   Kimberly Huynh was seen and examined today.  Feeling a lot better this morning. No nausea or vomiting. Did  have 2 episodes of diarrhea last night and one this morning. Still feeling weak, PT can help. No fevers or chills. Patient denies dizziness, chest pain, shortness of breath, abdominal pain.     Objective:   Vitals:   08/01/17 0804 08/01/17 1758 08/01/17 2214 08/02/17 1005  BP: 122/72 132/76 131/76 (!) 145/70  Pulse: 71 75 69 70  Resp: 17 17 16 17   Temp: 98.6 F (37 C) 98.2 F (36.8 C) 97.9 F (36.6 C) 98.4 F (36.9 C)  TempSrc: Oral Oral Oral Oral  SpO2: 92% 95% 97% 100%  Weight:      Height:        Intake/Output Summary (Last 24 hours) at 08/02/17 1432 Last data filed at 08/02/17 1131  Gross per 24 hour  Intake             1020 ml  Output              100 ml  Net              920 ml     Wt Readings from Last 3 Encounters:  07/31/17 65.8 kg (145 lb)  10/12/16 64.9 kg (143 lb)  09/13/16 65 kg (143 lb 6.4 oz)     Exam  General: Alert and oriented x 3, NAD Eyes:  HEENT:   Cardiovascular: S1 S2 auscultated, no rubs, murmurs or gallops. Regular rate and rhythm. No pedal edema b/l Respiratory: Clear to auscultation bilaterally, no wheezing, rales or rhonchi Gastrointestinal: Soft, nontender, nondistended, + bowel sounds Ext: no pedal edema bilaterally Neuro: no new deficits Musculoskeletal: No digital cyanosis, clubbing Skin: No rashes Psych: Normal affect and demeanor, alert and oriented x3      Data Reviewed:  I have personally reviewed following labs and imaging studies  Micro Results Recent Results (from the past 240 hour(s))  Culture, blood (routine x 2)     Status: None (Preliminary result)   Collection Time: 07/31/17  5:02 AM  Result Value Ref Range Status   Specimen Description BLOOD RIGHT ANTECUBITAL  Final   Special Requests   Final    BOTTLES DRAWN AEROBIC AND ANAEROBIC Blood Culture adequate volume   Culture NO GROWTH 1 DAY  Final   Report Status PENDING  Incomplete  Culture, blood (routine x 2)     Status: None (Preliminary result)    Collection Time: 07/31/17  5:07 AM  Result Value Ref Range Status   Specimen Description BLOOD LEFT  ANTECUBITAL  Final   Special Requests   Final    BOTTLES DRAWN AEROBIC AND ANAEROBIC Blood Culture adequate volume   Culture NO GROWTH 1 DAY  Final   Report Status PENDING  Incomplete  Gastrointestinal Panel by PCR , Stool     Status: None   Collection Time: 07/31/17 11:47 PM  Result Value Ref Range Status   Campylobacter species NOT DETECTED NOT DETECTED Final   Plesimonas shigelloides NOT DETECTED NOT DETECTED Final   Salmonella species NOT DETECTED NOT DETECTED Final   Yersinia enterocolitica NOT DETECTED NOT DETECTED Final   Vibrio species NOT DETECTED NOT DETECTED Final   Vibrio cholerae NOT DETECTED NOT DETECTED Final   Enteroaggregative E coli (EAEC) NOT DETECTED NOT DETECTED Final   Enteropathogenic E coli (EPEC) NOT DETECTED NOT DETECTED Final   Enterotoxigenic E coli (ETEC) NOT DETECTED NOT DETECTED Final   Shiga like toxin producing E coli (STEC) NOT DETECTED NOT DETECTED Final   Shigella/Enteroinvasive E coli (EIEC) NOT DETECTED NOT DETECTED Final   Cryptosporidium NOT DETECTED NOT DETECTED Final   Cyclospora cayetanensis NOT DETECTED NOT DETECTED Final   Entamoeba histolytica NOT DETECTED NOT DETECTED Final   Giardia lamblia NOT DETECTED NOT DETECTED Final   Adenovirus F40/41 NOT DETECTED NOT DETECTED Final   Astrovirus NOT DETECTED NOT DETECTED Final   Norovirus GI/GII NOT DETECTED NOT DETECTED Final   Rotavirus A NOT DETECTED NOT DETECTED Final   Sapovirus (I, II, IV, and V) NOT DETECTED NOT DETECTED Final  C difficile quick scan w PCR reflex     Status: None   Collection Time: 07/31/17 11:47 PM  Result Value Ref Range Status   C Diff antigen NEGATIVE NEGATIVE Final   C Diff toxin NEGATIVE NEGATIVE Final   C Diff interpretation No C. difficile detected.  Final    Radiology Reports Ct Abdomen Pelvis Wo Contrast  Result Date: 08/01/2017 CLINICAL DATA:  Abdominal  pain, nausea and vomiting for 1 week, some diarrhea EXAM: CT ABDOMEN AND PELVIS WITHOUT CONTRAST TECHNIQUE: Multidetector CT imaging of the abdomen and pelvis was performed following the standard protocol without IV contrast. COMPARISON:  Ultrasound the abdomen of 07/31/2017 FINDINGS: Lower chest: There is extensive parenchymal opacity within the right lower lobe with air bronchograms most consistent with a right lower lobe pneumonia. Small pleural effusions also were present. The heart is within normal limits in size. Hepatobiliary: The liver is low in attenuation consistent with diffuse fatty infiltration. A gallstone is noted in the neck of the gallbladder measuring 10 mm in diameter. No gallbladder distention or gallbladder wall thickening is seen. Pancreas: The pancreas is normal in size and the pancreatic duct is not dilated. Spleen: The spleen is unremarkable. Adrenals/Urinary Tract: The adrenal glands appear normal. No renal calculi are seen. There is no evidence of hydronephrosis. The ureters are normal in caliber. The urinary bladder is decompressed and cannot be evaluated. Stomach/Bowel: The stomach is largely decompressed. No small bowel distention is seen the colon is decompressed. The terminal ileum is unremarkable. The appendix is not definitely visualized. No inflammatory process is seen within the right lower quadrant. Vascular/Lymphatic: The abdominal aorta is normal in caliber with moderate abdominal aortic atherosclerosis present. No adenopathy is seen. Reproductive: The uterus is normal in size. No adnexal lesion is seen. No fluid is noted within the pelvis. Other: None. Musculoskeletal: The lumbar vertebrae are in normal alignment. Intervertebral disc spaces appear normal. The SI joints are corticated. IMPRESSION: 1. Right lower lobe pneumonia.  Small bilateral pleural effusions. 2. 10 mm gallstone in the neck of the gallbladder. No present CT evidence of acute cholecystitis is seen. Correlate  clinically. 3. Diffuse fatty infiltration of the liver. 4. Moderate abdominal aortic atherosclerosis. Electronically Signed   By: Ivar Drape M.D.   On: 08/01/2017 11:28   Dg Chest 2 View  Result Date: 07/31/2017 CLINICAL DATA:  Fever since Saturday. EXAM: CHEST  2 VIEW COMPARISON:  None FINDINGS: Right lower lobe and right middle lobe hazy airspace disease concerning for pneumonia. No other focal parenchymal opacity. No pleural effusion or pneumothorax. Normal cardiomediastinal silhouette. Thoracic aortic atherosclerosis. No acute osseous abnormality. IMPRESSION: Right lower lobe and right middle lobe hazy airspace disease most concerning for pneumonia. Followup PA and lateral chest X-ray is recommended in 3-4 weeks following trial of antibiotic therapy to ensure resolution and exclude underlying malignancy. Electronically Signed   By: Kathreen Devoid   On: 07/31/2017 13:36   Acute Abdominal Series  Result Date: 07/31/2017 CLINICAL DATA:  Nausea and vomiting for 3 days, history hypertension, diabetes mellitus, GERD, former smoker EXAM: DG ABDOMEN ACUTE W/ 1V CHEST COMPARISON:  None FINDINGS: Normal heart size, mediastinal contours, and pulmonary vascularity. RIGHT basilar infiltrate likely representing RIGHT lower lobe pneumonia. Question minimal LEFT basilar infiltrate as well. No pleural effusion or pneumothorax. Atherosclerotic calcification aorta. Rounded lamellated calcification RIGHT upper quadrant 10 mm diameter compatible with gallstone. Nonobstructive bowel gas pattern. No bowel dilatation or bowel wall thickening or free air. Scattered pelvic phleboliths. No definite urinary tract calcification. Bones demineralized. IMPRESSION: Bibasilar infiltrates greater on RIGHT consistent with lower lobe pneumonia. Cholelithiasis. Nonspecific bowel gas pattern. Electronically Signed   By: Lavonia Dana M.D.   On: 07/31/2017 08:51   US Abdomen Limited Ruq  Result Date: 07/31/2017 CLINICAL DATA:  Nausea,  vomiting, diarrhea. EXAM: ULTRASOUND ABDOMEN LIMITED RIGHT UPPER QUADRANT COMPARISON:  None. FINDINGS: Gallbladder: 9 mm gallstone is noted. No gallbladder wall thickening or pericholecystic fluid is noted. No sonographic Murphy's sign is noted. Common bile duct: Diameter: 4.2 mm which is within normal limits. Liver: No focal lesion identified. Increased echogenicity of hepatic parenchyma is noted. IMPRESSION: Solitary gallstone without evidence of cholecystitis. Probable fatty infiltration of the liver. Electronically Signed   By: Marijo Conception, M.D.   On: 07/31/2017 13:05    Lab Data:  CBC:  Recent Labs Lab 07/29/17 1000 07/31/17 0454 08/01/17 0228 08/02/17 0324  WBC 10.5 11.0* 8.3 6.9  NEUTROABS  --  8.9*  --   --   HGB 12.6 11.5* 9.6* 10.2*  HCT 35.4* 33.7* 28.1* 30.6*  MCV 83.9 84.7 84.1 85.2  PLT 263 261 237 160   Basic Metabolic Panel:  Recent Labs Lab 07/29/17 1000 07/31/17 0454 08/01/17 0228 08/02/17 0324  NA 130* 126* 133* 134*  K 3.5 3.2* 3.5 3.5  CL 93* 90* 104 104  CO2 24 18* 21* 21*  GLUCOSE 175* 143* 118* 115*  BUN 24* 29* 18 11  CREATININE 1.10* 1.29* 0.79 0.67  CALCIUM 9.7 9.4 8.0* 8.4*   GFR: Estimated Creatinine Clearance: 59.9 mL/min (by C-G formula based on SCr of 0.67 mg/dL). Liver Function Tests:  Recent Labs Lab 07/29/17 1000 07/31/17 0454 08/01/17 0228  AST 36 53* 64*  ALT 33 51 58*  ALKPHOS 37* 34* 33*  BILITOT 0.9 1.4* 0.7  PROT 7.9 6.9 5.2*  ALBUMIN 4.0 3.2* 2.4*    Recent Labs Lab 07/29/17 1000 07/31/17 0454  LIPASE 33 37   No results for  input(s): AMMONIA in the last 168 hours. Coagulation Profile: No results for input(s): INR, PROTIME in the last 168 hours. Cardiac Enzymes: No results for input(s): CKTOTAL, CKMB, CKMBINDEX, TROPONINI in the last 168 hours. BNP (last 3 results) No results for input(s): PROBNP in the last 8760 hours. HbA1C:  Recent Labs  07/31/17 0805  HGBA1C 6.3*   CBG:  Recent Labs Lab  08/01/17 1138 08/01/17 1712 08/01/17 2002 08/02/17 0800 08/02/17 1241  GLUCAP 105* 109* 132* 106* 151*   Lipid Profile: No results for input(s): CHOL, HDL, LDLCALC, TRIG, CHOLHDL, LDLDIRECT in the last 72 hours. Thyroid Function Tests: No results for input(s): TSH, T4TOTAL, FREET4, T3FREE, THYROIDAB in the last 72 hours. Anemia Panel: No results for input(s): VITAMINB12, FOLATE, FERRITIN, TIBC, IRON, RETICCTPCT in the last 72 hours. Urine analysis:    Component Value Date/Time   COLORURINE YELLOW 07/31/2017 1601   APPEARANCEUR HAZY (A) 07/31/2017 1601   LABSPEC 1.021 07/31/2017 1601   PHURINE 6.0 07/31/2017 1601   GLUCOSEU NEGATIVE 07/31/2017 1601   HGBUR NEGATIVE 07/31/2017 1601   BILIRUBINUR NEGATIVE 07/31/2017 1601   KETONESUR 5 (A) 07/31/2017 1601   PROTEINUR 30 (A) 07/31/2017 1601   NITRITE NEGATIVE 07/31/2017 1601   LEUKOCYTESUR NEGATIVE 07/31/2017 1601     Amira Podolak M.D. [facility] 08/02/2017, 2:32 PM  Pager: [phone] Between 7am to 7pm - call Pager - [phone]  After 7pm go to [email or web address] - password [email or web address]  Call night coverage person covering after 7pm            [facility]                                                                              Patient Demographics  Kimberly Huynh, is a 66 y.o. female, DOB - 08/30/1951, [id number]  Admit date - 07/31/2017   Admitting Physician Waldemar Dickens, MD  Outpatient Primary MD for the patient is Avva, Steva Ready, MD  Outpatient specialists:   LOS - 1  days   Medical records reviewed and are as summarized below:    Chief Complaint  Patient presents with  . Emesis  . Diarrhea       Brief summary   Patient is a 49 -year-old female with diabetes, hypertension, hyperlipidemia, anxiety presented with one-week history of nausea vomiting, diarrhea. Patient was seen in ED on 8/13 with nausea vomiting and fever. Workup at that time suggested a viral gastroenteritis. Patient was  discharged home with IV fluid hydration and was given a prescription for Zofran. Patient reported that her symptoms persisted with the onset of diarrhea. She describes decrease in oral intake over the course of last period   Assessment & Plan    Principal Problem:   Acute gastroenteritis - Per patient diarrhea still not improving, vomiting is somewhat better however still feels nauseous but feels that she may be able to handle clears -will place on clear liquid diet, continue IV fluid hydration - Continue antiemetics - CT abdomen done, no colitis. Gallstone however no acute cholecystitis - C. difficile negative, placed on Imodium - GI pathogen panel pending - Blood cultures negative - Antibiotics changed to IV Rocephin and Flagyl  Active Problems:  Right lung pneumonia:  Possibly due to aspiration - Continue IV Rocephin and Flagyl - Right lung rhonchorous, EKG done, QTC 430, if no significant improvement, will change to IV Levaquin  Dehydration with mild hyponatremia - Improving, continue IV fluid hydration - Sodium 126 at the time of admission, improving  Acute kidney injury - Improving, creatinine 1.29 at the time of admission - Hold olmesartan, HCTZ     Hypertension - Hold olmesartan, HCTZ, continue IV fluid hydration       Type 2 diabetes mellitus ([place]) -  hold oral meds while inpatient, continue sliding scale insulin   Code Status: full  DVT Prophylaxis:  Lovenox  Family Communication: Discussed in detail with the patient, all imaging results, lab results explained to the patient and husband    Disposition Plan:   Time Spent in minutes  25 minutes  Procedures:    Consultants:    Antimicrobials:   IV Rocephin 8/15  IV Flagyl 8/16   Medications  Scheduled Meds: . aspirin  81 mg Oral Daily  . atenolol  25 mg Oral Daily  . atorvastatin  20 mg Oral Daily  . benzonatate  100 mg Oral TID  . enoxaparin (LOVENOX) injection  40 mg Subcutaneous Q24H  .  fluticasone  2 spray Each Nare Daily  . insulin aspart  0-9 Units Subcutaneous TID WC  . pantoprazole (PROTONIX) IV  40 mg Intravenous Q24H  . timolol  1 drop Both Eyes BID   Continuous Infusions: . 0.9 % NaCl with KCl 20 mEq / L 75 mL/hr at 08/02/17 1220  . cefTRIAXone (ROCEPHIN)  IV Stopped (08/01/17 1620)  . metronidazole Stopped (08/02/17 1131)   PRN Meds:.acetaminophen **OR** acetaminophen, ALPRAZolam, guaiFENesin-codeine, loperamide, ondansetron **OR** ondansetron (ZOFRAN) IV   Antibiotics   Anti-infectives    Start     Dose/Rate Route Frequency Ordered Stop   08/01/17 1000  metroNIDAZOLE (FLAGYL) IVPB 500 mg     500 mg 100 mL/hr over 60 Minutes Intravenous Every 8 hours 08/01/17 0916     07/31/17 1500  cefTRIAXone (ROCEPHIN) 1 g in dextrose 5 % 50 mL IVPB     1 g 100 mL/hr over 30 Minutes Intravenous Every 24 hours 07/31/17 1355 08/07/17 1459   07/31/17 1500  azithromycin (ZITHROMAX) 500 mg in dextrose 5 % 250 mL IVPB  Status:  Discontinued     500 mg 250 mL/hr over 60 Minutes Intravenous Every 24 hours 07/31/17 1355 08/01/17 0916        Subjective:   Kimberly Huynh was seen and examined today.  Feeling miserable, states diarrhea is still on. No significant vomiting but he'll still feels nauseous. Has not had much to eat for last 1 week. Feels very weak. Patient denies dizziness, chest pain, shortness of breath, abdominal pain. Low-grade fevers overnight.    Objective:   Vitals:   08/01/17 0804 08/01/17 1758 08/01/17 2214 08/02/17 1005  BP: 122/72 132/76 131/76 (!) 145/70  Pulse: 71 75 69 70  Resp: 17 17 16 17   Temp: 98.6 F (37 C) 98.2 F (36.8 C) 97.9 F (36.6 C) 98.4 F (36.9 C)  TempSrc: Oral Oral Oral Oral  SpO2: 92% 95% 97% 100%  Weight:      Height:        Intake/Output Summary (Last 24 hours) at 08/02/17 1432 Last data filed at 08/02/17 1131  Gross per 24 hour  Intake             1020 ml  Output  100 ml  Net              920 ml      Wt Readings from Last 3 Encounters:  07/31/17 65.8 kg (145 lb)  10/12/16 64.9 kg (143 lb)  09/13/16 65 kg (143 lb 6.4 oz)     Exam  General: Alert and oriented x 3, NAD  Eyes: PERRLA, EOMI, Anicteric Sclera,  HEENT:  Atraumatic, normocephalic, normal oropharynx  Cardiovascular: S1 S2 auscultated, no rubs, murmurs or gallops. Regular rate and rhythm.  Respiratory: Clear to auscultation bilaterally, no wheezing, rales or rhonchi  Gastrointestinal: Soft, nontender, nondistended, + bowel sounds  Ext: no pedal edema bilaterally  Neuro: AAOx3, Cr N's II- XII. Strength 5/5 upper and lower extremities bilaterally, speech clear, sensations grossly intact  Musculoskeletal: No digital cyanosis, clubbing  Skin: No rashes  Psych: Normal affect and demeanor, alert and oriented x3    Data Reviewed:  I have personally reviewed following labs and imaging studies  Micro Results Recent Results (from the past 240 hour(s))  Culture, blood (routine x 2)     Status: None (Preliminary result)   Collection Time: 07/31/17  5:02 AM  Result Value Ref Range Status   Specimen Description BLOOD RIGHT ANTECUBITAL  Final   Special Requests   Final    BOTTLES DRAWN AEROBIC AND ANAEROBIC Blood Culture adequate volume   Culture NO GROWTH 1 DAY  Final   Report Status PENDING  Incomplete  Culture, blood (routine x 2)     Status: None (Preliminary result)   Collection Time: 07/31/17  5:07 AM  Result Value Ref Range Status   Specimen Description BLOOD LEFT ANTECUBITAL  Final   Special Requests   Final    BOTTLES DRAWN AEROBIC AND ANAEROBIC Blood Culture adequate volume   Culture NO GROWTH 1 DAY  Final   Report Status PENDING  Incomplete  Gastrointestinal Panel by PCR , Stool     Status: None   Collection Time: 07/31/17 11:47 PM  Result Value Ref Range Status   Campylobacter species NOT DETECTED NOT DETECTED Final   Plesimonas shigelloides NOT DETECTED NOT DETECTED Final   Salmonella species  NOT DETECTED NOT DETECTED Final   Yersinia enterocolitica NOT DETECTED NOT DETECTED Final   Vibrio species NOT DETECTED NOT DETECTED Final   Vibrio cholerae NOT DETECTED NOT DETECTED Final   Enteroaggregative E coli (EAEC) NOT DETECTED NOT DETECTED Final   Enteropathogenic E coli (EPEC) NOT DETECTED NOT DETECTED Final   Enterotoxigenic E coli (ETEC) NOT DETECTED NOT DETECTED Final   Shiga like toxin producing E coli (STEC) NOT DETECTED NOT DETECTED Final   Shigella/Enteroinvasive E coli (EIEC) NOT DETECTED NOT DETECTED Final   Cryptosporidium NOT DETECTED NOT DETECTED Final   Cyclospora cayetanensis NOT DETECTED NOT DETECTED Final   Entamoeba histolytica NOT DETECTED NOT DETECTED Final   Giardia lamblia NOT DETECTED NOT DETECTED Final   Adenovirus F40/41 NOT DETECTED NOT DETECTED Final   Astrovirus NOT DETECTED NOT DETECTED Final   Norovirus GI/GII NOT DETECTED NOT DETECTED Final   Rotavirus A NOT DETECTED NOT DETECTED Final   Sapovirus (I, II, IV, and V) NOT DETECTED NOT DETECTED Final  C difficile quick scan w PCR reflex     Status: None   Collection Time: 07/31/17 11:47 PM  Result Value Ref Range Status   C Diff antigen NEGATIVE NEGATIVE Final   C Diff toxin NEGATIVE NEGATIVE Final   C Diff interpretation No C. difficile detected.  Final  Radiology Reports Ct Abdomen Pelvis Wo Contrast  Result Date: 08/01/2017 CLINICAL DATA:  Abdominal pain, nausea and vomiting for 1 week, some diarrhea EXAM: CT ABDOMEN AND PELVIS WITHOUT CONTRAST TECHNIQUE: Multidetector CT imaging of the abdomen and pelvis was performed following the standard protocol without IV contrast. COMPARISON:  Ultrasound the abdomen of 07/31/2017 FINDINGS: Lower chest: There is extensive parenchymal opacity within the right lower lobe with air bronchograms most consistent with a right lower lobe pneumonia. Small pleural effusions also were present. The heart is within normal limits in size. Hepatobiliary: The liver is low  in attenuation consistent with diffuse fatty infiltration. A gallstone is noted in the neck of the gallbladder measuring 10 mm in diameter. No gallbladder distention or gallbladder wall thickening is seen. Pancreas: The pancreas is normal in size and the pancreatic duct is not dilated. Spleen: The spleen is unremarkable. Adrenals/Urinary Tract: The adrenal glands appear normal. No renal calculi are seen. There is no evidence of hydronephrosis. The ureters are normal in caliber. The urinary bladder is decompressed and cannot be evaluated. Stomach/Bowel: The stomach is largely decompressed. No small bowel distention is seen the colon is decompressed. The terminal ileum is unremarkable. The appendix is not definitely visualized. No inflammatory process is seen within the right lower quadrant. Vascular/Lymphatic: The abdominal aorta is normal in caliber with moderate abdominal aortic atherosclerosis present. No adenopathy is seen. Reproductive: The uterus is normal in size. No adnexal lesion is seen. No fluid is noted within the pelvis. Other: None. Musculoskeletal: The lumbar vertebrae are in normal alignment. Intervertebral disc spaces appear normal. The SI joints are corticated. IMPRESSION: 1. Right lower lobe pneumonia.  Small bilateral pleural effusions. 2. 10 mm gallstone in the neck of the gallbladder. No present CT evidence of acute cholecystitis is seen. Correlate clinically. 3. Diffuse fatty infiltration of the liver. 4. Moderate abdominal aortic atherosclerosis. Electronically Signed   By: Ivar Drape M.D.   On: 08/01/2017 11:28   Dg Chest 2 View  Result Date: 07/31/2017 CLINICAL DATA:  Fever since Saturday. EXAM: CHEST  2 VIEW COMPARISON:  None FINDINGS: Right lower lobe and right middle lobe hazy airspace disease concerning for pneumonia. No other focal parenchymal opacity. No pleural effusion or pneumothorax. Normal cardiomediastinal silhouette. Thoracic aortic atherosclerosis. No acute osseous  abnormality. IMPRESSION: Right lower lobe and right middle lobe hazy airspace disease most concerning for pneumonia. Followup PA and lateral chest X-ray is recommended in 3-4 weeks following trial of antibiotic therapy to ensure resolution and exclude underlying malignancy. Electronically Signed   By: Kathreen Devoid   On: 07/31/2017 13:36   Acute Abdominal Series  Result Date: 07/31/2017 CLINICAL DATA:  Nausea and vomiting for 3 days, history hypertension, diabetes mellitus, GERD, former smoker EXAM: DG ABDOMEN ACUTE W/ 1V CHEST COMPARISON:  None FINDINGS: Normal heart size, mediastinal contours, and pulmonary vascularity. RIGHT basilar infiltrate likely representing RIGHT lower lobe pneumonia. Question minimal LEFT basilar infiltrate as well. No pleural effusion or pneumothorax. Atherosclerotic calcification aorta. Rounded lamellated calcification RIGHT upper quadrant 10 mm diameter compatible with gallstone. Nonobstructive bowel gas pattern. No bowel dilatation or bowel wall thickening or free air. Scattered pelvic phleboliths. No definite urinary tract calcification. Bones demineralized. IMPRESSION: Bibasilar infiltrates greater on RIGHT consistent with lower lobe pneumonia. Cholelithiasis. Nonspecific bowel gas pattern. Electronically Signed   By: Lavonia Dana M.D.   On: 07/31/2017 08:51   US Abdomen Limited Ruq  Result Date: 07/31/2017 CLINICAL DATA:  Nausea, vomiting, diarrhea. EXAM: ULTRASOUND ABDOMEN LIMITED RIGHT  UPPER QUADRANT COMPARISON:  None. FINDINGS: Gallbladder: 9 mm gallstone is noted. No gallbladder wall thickening or pericholecystic fluid is noted. No sonographic Murphy's sign is noted. Common bile duct: Diameter: 4.2 mm which is within normal limits. Liver: No focal lesion identified. Increased echogenicity of hepatic parenchyma is noted. IMPRESSION: Solitary gallstone without evidence of cholecystitis. Probable fatty infiltration of the liver. Electronically Signed   By: Marijo Conception,  M.D.   On: 07/31/2017 13:05    Lab Data:  CBC:  Recent Labs Lab 07/29/17 1000 07/31/17 0454 08/01/17 0228 08/02/17 0324  WBC 10.5 11.0* 8.3 6.9  NEUTROABS  --  8.9*  --   --   HGB 12.6 11.5* 9.6* 10.2*  HCT 35.4* 33.7* 28.1* 30.6*  MCV 83.9 84.7 84.1 85.2  PLT 263 261 237 549   Basic Metabolic Panel:  Recent Labs Lab 07/29/17 1000 07/31/17 0454 08/01/17 0228 08/02/17 0324  NA 130* 126* 133* 134*  K 3.5 3.2* 3.5 3.5  CL 93* 90* 104 104  CO2 24 18* 21* 21*  GLUCOSE 175* 143* 118* 115*  BUN 24* 29* 18 11  CREATININE 1.10* 1.29* 0.79 0.67  CALCIUM 9.7 9.4 8.0* 8.4*   GFR: Estimated Creatinine Clearance: 59.9 mL/min (by C-G formula based on SCr of 0.67 mg/dL). Liver Function Tests:  Recent Labs Lab 07/29/17 1000 07/31/17 0454 08/01/17 0228  AST 36 53* 64*  ALT 33 51 58*  ALKPHOS 37* 34* 33*  BILITOT 0.9 1.4* 0.7  PROT 7.9 6.9 5.2*  ALBUMIN 4.0 3.2* 2.4*    Recent Labs Lab 07/29/17 1000 07/31/17 0454  LIPASE 33 37   No results for input(s): AMMONIA in the last 168 hours. Coagulation Profile: No results for input(s): INR, PROTIME in the last 168 hours. Cardiac Enzymes: No results for input(s): CKTOTAL, CKMB, CKMBINDEX, TROPONINI in the last 168 hours. BNP (last 3 results) No results for input(s): PROBNP in the last 8760 hours. HbA1C:  Recent Labs  07/31/17 0805  HGBA1C 6.3*   CBG:  Recent Labs Lab 08/01/17 1138 08/01/17 1712 08/01/17 2002 08/02/17 0800 08/02/17 1241  GLUCAP 105* 109* 132* 106* 151*   Lipid Profile: No results for input(s): CHOL, HDL, LDLCALC, TRIG, CHOLHDL, LDLDIRECT in the last 72 hours. Thyroid Function Tests: No results for input(s): TSH, T4TOTAL, FREET4, T3FREE, THYROIDAB in the last 72 hours. Anemia Panel: No results for input(s): VITAMINB12, FOLATE, FERRITIN, TIBC, IRON, RETICCTPCT in the last 72 hours. Urine analysis:    Component Value Date/Time   COLORURINE YELLOW 07/31/2017 1601   APPEARANCEUR HAZY (A)  07/31/2017 1601   LABSPEC 1.021 07/31/2017 1601   PHURINE 6.0 07/31/2017 1601   GLUCOSEU NEGATIVE 07/31/2017 1601   HGBUR NEGATIVE 07/31/2017 1601   BILIRUBINUR NEGATIVE 07/31/2017 1601   KETONESUR 5 (A) 07/31/2017 1601   PROTEINUR 30 (A) 07/31/2017 1601   NITRITE NEGATIVE 07/31/2017 1601   LEUKOCYTESUR NEGATIVE 07/31/2017 1601     Shinichi Anguiano M.D. [facility] 08/02/2017, 2:32 PM  Pager: [phone] Between 7am to 7pm - call Pager - [phone]  After 7pm go to [email or web address] - password [email or web address]  Call night coverage person covering after 7pm

## 2017-08-03 LAB — CBC
HEMATOCRIT: 28.9 % — AB (ref 36.0–46.0)
Hemoglobin: 9.7 g/dL — ABNORMAL LOW (ref 12.0–15.0)
MCH: 28.9 pg (ref 26.0–34.0)
MCHC: 33.6 g/dL (ref 30.0–36.0)
MCV: 86 fL (ref 78.0–100.0)
Platelets: 329 10*3/uL (ref 150–400)
RBC: 3.36 MIL/uL — ABNORMAL LOW (ref 3.87–5.11)
RDW: 14.8 % (ref 11.5–15.5)
WBC: 5.8 10*3/uL (ref 4.0–10.5)

## 2017-08-03 LAB — BASIC METABOLIC PANEL
ANION GAP: 9 (ref 5–15)
BUN: 8 mg/dL (ref 6–20)
CALCIUM: 8.4 mg/dL — AB (ref 8.9–10.3)
CO2: 21 mmol/L — AB (ref 22–32)
Chloride: 108 mmol/L (ref 101–111)
Creatinine, Ser: 0.64 mg/dL (ref 0.44–1.00)
GFR calc Af Amer: >60 mL/min (ref >60)
GFR calc non Af Amer: >60 mL/min (ref >60)
GLUCOSE: 144 mg/dL — AB (ref 65–99)
Potassium: 4 mmol/L (ref 3.5–5.1)
Sodium: 138 mmol/L (ref 135–145)

## 2017-08-03 LAB — GLUCOSE, CAPILLARY
Glucose-Capillary: 102 mg/dL — ABNORMAL HIGH (ref 65–99)
Glucose-Capillary: 126 mg/dL — ABNORMAL HIGH (ref 65–99)

## 2017-08-03 MED ORDER — CEFUROXIME AXETIL 500 MG PO TABS: 500.0000 mg | ORAL_TABLET | Freq: Two times a day (BID) | ORAL | 0 refills | Status: AC

## 2017-08-03 MED ORDER — CEFUROXIME AXETIL 500 MG PO TABS
500.0000 mg | ORAL_TABLET | Freq: Two times a day (BID) | ORAL | Status: DC
  Administered 2017-08-03: 500 mg via ORAL
  Filled 2017-08-03: qty 1

## 2017-08-03 MED ORDER — LOPERAMIDE HCL 2 MG PO CAPS
4.0000 mg | ORAL_CAPSULE | Freq: Once | ORAL | Status: AC
  Administered 2017-08-03: 4 mg via ORAL
  Filled 2017-08-03: qty 2

## 2017-08-03 MED ORDER — ONDANSETRON 4 MG PO TBDP: 4.0000 mg | ORAL_TABLET | Freq: Three times a day (TID) | ORAL | 0 refills | Status: AC | PRN

## 2017-08-03 MED ORDER — BENZONATATE 100 MG PO CAPS: 100.0000 mg | ORAL_CAPSULE | Freq: Three times a day (TID) | ORAL | 0 refills | Status: AC | PRN

## 2017-08-03 MED ORDER — LOPERAMIDE HCL 2 MG PO CAPS: 2.0000 mg | ORAL_CAPSULE | ORAL | 0 refills | Status: AC | PRN

## 2017-08-03 MED ORDER — METRONIDAZOLE 500 MG PO TABS
500.0000 mg | ORAL_TABLET | Freq: Three times a day (TID) | ORAL | Status: DC
  Administered 2017-08-03: 500 mg via ORAL
  Filled 2017-08-03: qty 1

## 2017-08-03 MED ORDER — METRONIDAZOLE 500 MG PO TABS: 500.0000 mg | ORAL_TABLET | Freq: Three times a day (TID) | ORAL | 0 refills | Status: AC

## 2017-08-03 NOTE — Discharge Summary (Signed)
Physician Discharge Summary   Patient ID: Kimberly Huynh MRN: [id number] DOB/AGE: 08-21-1951 66 y.o.  Admit date: 07/31/2017 Discharge date: 08/03/2017  Primary Care Physician:  Prince Solian, MD  Discharge Diagnoses:    . Hypertension . Hyperlipidemia . Acute gastroenteritis . Pneumonia . Vomiting and diarrhea   Consults:  None  Recommendations for Outpatient Follow-up:  1. Please repeat CBC/BMET at next visit   DIET: Soft diet    Allergies:   Allergies  Allergen Reactions  . Amoxicillin-Pot Clavulanate Nausea And Vomiting  . Travoprost Other (See Comments)    Red, irritated eyes     DISCHARGE MEDICATIONS: Current Discharge Medication List    START taking these medications   Details  benzonatate (TESSALON) 100 MG capsule Take 1 capsule (100 mg total) by mouth 3 (three) times daily as needed for cough. Qty: 30 capsule, Refills: 0    cefUROXime (CEFTIN) 500 MG tablet Take 1 tablet (500 mg total) by mouth 2 (two) times daily with a meal. X 5 more days Qty: 10 tablet, Refills: 0    loperamide (IMODIUM) 2 MG capsule Take 1 capsule (2 mg total) by mouth as needed for diarrhea or loose stools. Over the counter Qty: 30 capsule, Refills: 0    metroNIDAZOLE (FLAGYL) 500 MG tablet Take 1 tablet (500 mg total) by mouth 3 (three) times daily. X 5 more days Qty: 16 tablet, Refills: 0      CONTINUE these medications which have CHANGED   Details  ondansetron (ZOFRAN ODT) 4 MG disintegrating tablet Take 1 tablet (4 mg total) by mouth every 8 (eight) hours as needed for nausea or vomiting. Qty: 30 tablet, Refills: 0      CONTINUE these medications which have NOT CHANGED   Details  ALPRAZolam (XANAX) 0.5 MG tablet Take 0.25 mg by mouth at bedtime as needed for anxiety or sleep.     aspirin 81 MG tablet Take 81 mg by mouth daily.      atenolol (TENORMIN) 25 MG tablet Take 25 mg by mouth daily.    atorvastatin (LIPITOR) 20 MG tablet Take 20 mg by mouth daily.     Cholecalciferol 2000 units CAPS Take 2,000 Units by mouth daily.    Choline Fenofibrate 135 MG capsule Take 1 capsule (135 mg total) by mouth daily. Qty: 30 capsule, Refills: 4    ezetimibe (ZETIA) 10 MG tablet Take 10 mg by mouth every evening.     famotidine (PEPCID AC) 10 MG chewable tablet Chew 10 mg by mouth 2 (two) times daily.    fluticasone (FLONASE) 50 MCG/ACT nasal spray Place 2 sprays into both nostrils daily.    levocetirizine (XYZAL) 5 MG tablet Take 5 mg by mouth every evening.      olmesartan-hydrochlorothiazide (BENICAR HCT) 20-12.5 MG tablet Take 1 tablet by mouth daily.    SitaGLIPtin-MetFORMIN HCl (JANUMET XR) [phone] MG TB24 Take 1 tablet by mouth every evening.     sodium chloride (OCEAN) 0.65 % SOLN nasal spray Place 1 spray into both nostrils as needed for congestion.    Timolol Hemihydrate (BETIMOL OP) Apply 1 drop to eye 2 (two) times daily. Both eyes    ONE TOUCH ULTRA TEST test strip          Brief H and P: For complete details please refer to admission H and P, but in briefPatient is a 66 -year-old female with diabetes, hypertension, hyperlipidemia, anxiety presented with one-week history of nausea vomiting, diarrhea. Patient was seen in ED on 8/13 with  nausea vomiting and fever. Workup at that time suggested a viral gastroenteritis. Patient was discharged home with IV fluid hydration and was given a prescription for Zofran. Patient reported that her symptoms persisted with the onset of diarrhea. She describes decrease in oral intake over the course of last period.   Hospital Course:   Acute gastroenteritis - Nausea vomiting improved, advanced to soft diet - CT abdomen and pelvis obtained, no colitis, Gallstone however no acute cholecystitis. - C. difficile negative.  CT abdomen done, no colitis. Gallstone however no acute cholecystitis - Continue Imodium as needed  - GI pathogen panel negative - Blood cultures negative   Right lung pneumonia:  Possibly due to aspiration - Clinically improving, continue Ceftin, Flagyl for 5 more days  Dehydration with mild hyponatremia - Sodium 126 at the time of admission -Improved with IV fluids, 138 at the time of discharge  Acute kidney injury - Improving, creatinine 1.29 at the time of admission - Resolved, creatinine 0.64, patient may restart her antihypertensives    Hypertension - Held olmesartan, HCT through the hospitalization - Currently resolved, creatinine 0.64, sodium 138, patient may restart her antihypertensives      Type 2 diabetes mellitus ([place]) -   patient was placed on siding scale insulin while inpatient  Day of Discharge BP (!) 151/86   Pulse 77   Temp 97.8 F (36.6 C) (Oral)   Resp 18   Ht 5\' 2"  (1.575 m)   Wt 65.8 kg (145 lb)   SpO2 96%   BMI 26.52 kg/m   Physical Exam: General: Alert and awake oriented x3 not in any acute distress. HEENT: anicteric sclera, pupils reactive to light and accommodation CVS: S1-S2 clear no murmur rubs or gallops Chest: clear to auscultation bilaterally, no wheezing rales or rhonchi Abdomen: soft nontender, nondistended, normal bowel sounds Extremities: no cyanosis, clubbing or edema noted bilaterally Neuro: Cranial nerves II-XII intact, no focal neurological deficits   The results of significant diagnostics from this hospitalization (including imaging, microbiology, ancillary and laboratory) are listed below for reference.    LAB RESULTS: Basic Metabolic Panel:  Recent Labs Lab 08/02/17 0324 08/03/17 0220  NA 134* 138  K 3.5 4.0  CL 104 108  CO2 21* 21*  GLUCOSE 115* 144*  BUN 11 8  CREATININE 0.67 0.64  CALCIUM 8.4* 8.4*   Liver Function Tests:  Recent Labs Lab 07/31/17 0454 08/01/17 0228  AST 53* 64*  ALT 51 58*  ALKPHOS 34* 33*  BILITOT 1.4* 0.7  PROT 6.9 5.2*  ALBUMIN 3.2* 2.4*    Recent Labs Lab 07/29/17 1000 07/31/17 0454  LIPASE 33 37   No results for input(s): AMMONIA in the last  168 hours. CBC:  Recent Labs Lab 07/31/17 0454  08/02/17 0324 08/03/17 0220  WBC 11.0*  < > 6.9 5.8  NEUTROABS 8.9*  --   --   --   HGB 11.5*  < > 10.2* 9.7*  HCT 33.7*  < > 30.6* 28.9*  MCV 84.7  < > 85.2 86.0  PLT 261  < > 307 329  < > = values in this interval not displayed. Cardiac Enzymes: No results for input(s): CKTOTAL, CKMB, CKMBINDEX, TROPONINI in the last 168 hours. BNP: Invalid input(s): POCBNP CBG:  Recent Labs Lab 08/03/17 0747 08/03/17 1215  GLUCAP 102* 126*    Significant Diagnostic Studies:  Ct Abdomen Pelvis Wo Contrast  Result Date: 08/01/2017 CLINICAL DATA:  Abdominal pain, nausea and vomiting for 1 week, some diarrhea EXAM:  CT ABDOMEN AND PELVIS WITHOUT CONTRAST TECHNIQUE: Multidetector CT imaging of the abdomen and pelvis was performed following the standard protocol without IV contrast. COMPARISON:  Ultrasound the abdomen of 07/31/2017 FINDINGS: Lower chest: There is extensive parenchymal opacity within the right lower lobe with air bronchograms most consistent with a right lower lobe pneumonia. Small pleural effusions also were present. The heart is within normal limits in size. Hepatobiliary: The liver is low in attenuation consistent with diffuse fatty infiltration. A gallstone is noted in the neck of the gallbladder measuring 10 mm in diameter. No gallbladder distention or gallbladder wall thickening is seen. Pancreas: The pancreas is normal in size and the pancreatic duct is not dilated. Spleen: The spleen is unremarkable. Adrenals/Urinary Tract: The adrenal glands appear normal. No renal calculi are seen. There is no evidence of hydronephrosis. The ureters are normal in caliber. The urinary bladder is decompressed and cannot be evaluated. Stomach/Bowel: The stomach is largely decompressed. No small bowel distention is seen the colon is decompressed. The terminal ileum is unremarkable. The appendix is not definitely visualized. No inflammatory process is  seen within the right lower quadrant. Vascular/Lymphatic: The abdominal aorta is normal in caliber with moderate abdominal aortic atherosclerosis present. No adenopathy is seen. Reproductive: The uterus is normal in size. No adnexal lesion is seen. No fluid is noted within the pelvis. Other: None. Musculoskeletal: The lumbar vertebrae are in normal alignment. Intervertebral disc spaces appear normal. The SI joints are corticated. IMPRESSION: 1. Right lower lobe pneumonia.  Small bilateral pleural effusions. 2. 10 mm gallstone in the neck of the gallbladder. No present CT evidence of acute cholecystitis is seen. Correlate clinically. 3. Diffuse fatty infiltration of the liver. 4. Moderate abdominal aortic atherosclerosis. Electronically Signed   By: Ivar Drape M.D.   On: 08/01/2017 11:28   Dg Chest 2 View  Result Date: 07/31/2017 CLINICAL DATA:  Fever since Saturday. EXAM: CHEST  2 VIEW COMPARISON:  None FINDINGS: Right lower lobe and right middle lobe hazy airspace disease concerning for pneumonia. No other focal parenchymal opacity. No pleural effusion or pneumothorax. Normal cardiomediastinal silhouette. Thoracic aortic atherosclerosis. No acute osseous abnormality. IMPRESSION: Right lower lobe and right middle lobe hazy airspace disease most concerning for pneumonia. Followup PA and lateral chest X-ray is recommended in 3-4 weeks following trial of antibiotic therapy to ensure resolution and exclude underlying malignancy. Electronically Signed   By: Kathreen Devoid   On: 07/31/2017 13:36   Acute Abdominal Series  Result Date: 07/31/2017 CLINICAL DATA:  Nausea and vomiting for 3 days, history hypertension, diabetes mellitus, GERD, former smoker EXAM: DG ABDOMEN ACUTE W/ 1V CHEST COMPARISON:  None FINDINGS: Normal heart size, mediastinal contours, and pulmonary vascularity. RIGHT basilar infiltrate likely representing RIGHT lower lobe pneumonia. Question minimal LEFT basilar infiltrate as well. No pleural  effusion or pneumothorax. Atherosclerotic calcification aorta. Rounded lamellated calcification RIGHT upper quadrant 10 mm diameter compatible with gallstone. Nonobstructive bowel gas pattern. No bowel dilatation or bowel wall thickening or free air. Scattered pelvic phleboliths. No definite urinary tract calcification. Bones demineralized. IMPRESSION: Bibasilar infiltrates greater on RIGHT consistent with lower lobe pneumonia. Cholelithiasis. Nonspecific bowel gas pattern. Electronically Signed   By: Lavonia Dana M.D.   On: 07/31/2017 08:51   US Abdomen Limited Ruq  Result Date: 07/31/2017 CLINICAL DATA:  Nausea, vomiting, diarrhea. EXAM: ULTRASOUND ABDOMEN LIMITED RIGHT UPPER QUADRANT COMPARISON:  None. FINDINGS: Gallbladder: 9 mm gallstone is noted. No gallbladder wall thickening or pericholecystic fluid is noted. No sonographic Murphy's sign  is noted. Common bile duct: Diameter: 4.2 mm which is within normal limits. Liver: No focal lesion identified. Increased echogenicity of hepatic parenchyma is noted. IMPRESSION: Solitary gallstone without evidence of cholecystitis. Probable fatty infiltration of the liver. Electronically Signed   By: Marijo Conception, M.D.   On: 07/31/2017 13:05    2D ECHO:   Disposition and Follow-up: Discharge Instructions    Diet Carb Modified    Complete by:  As directed    Discharge instructions    Complete by:  As directed    SOFT OR FULL LIQUID DIET, advance as tolerated   Increase activity slowly    Complete by:  As directed        DISPOSITION: [place]    Avva, Ravisankar, MD. Schedule an appointment as soon as possible for a visit in 2 week(s).   Specialty:  Internal Medicine Contact information: [place] [id number] [place], [facility].   Why:  physical therapy Contact information: [address] [place] [address] [phone]             Time spent on Discharge: 35 minutes  Signed:   Estill Cotta M.D. [facility] 08/03/2017, 1:37 PM Pager: [phone]

## 2017-08-03 NOTE — Progress Notes (Signed)
Physical Therapy Treatment Patient Details Name: Kimberly Huynh MRN: [id number] DOB: 11-22-1951 Today's Date: 08/03/2017    History of Present Illness Pt is a 66 yo female admitted through ED on 07/31/17 following a week long hisotry of nausea, vomiting and diarrhea. Pt was diagnosed with viral gastroenteritis, dehydration, AKI, hypokalemia. PMH significant for DM2, HTN, HLD, anxiety, GERD.     PT Comments    Pt demonstrates good tolerance for gait training this session without a RW. Pt will have her husband at home and will always have assistance when leaving home. Pt advised to use RW when leaving home for safety. Pt deferred stair negotiation training this session due to fatigue and plans to sleep on main level when she first returns home.     Follow Up Recommendations  Home health PT     Equipment Recommendations  None recommended by PT    Recommendations for Other Services       Precautions / Restrictions Precautions Precautions: Fall Restrictions Weight Bearing Restrictions: No    Mobility  Bed Mobility               General bed mobility comments: Up in recliner when PT arrives  Transfers Overall transfer level: Needs assistance Equipment used: None Transfers: Sit to/from Stand Sit to Stand: Modified independent (Device/Increase time)         General transfer comment: Mod I this session. Pt greets PT at door  Ambulation/Gait Ambulation/Gait assistance: Supervision;Min guard Ambulation Distance (Feet): 500 Feet Assistive device: None Gait Pattern/deviations: Step-through pattern;Narrow base of support Gait velocity: decreased Gait velocity interpretation: Below normal speed for age/gender General Gait Details: a little slower and LOB x 2 with min guard for recovery with head movements. Pt is a little less secure without RW. Advised to use RW when leaving home   Stairs            Wheelchair Mobility    Modified Rankin (Stroke Patients Only)       Balance Overall balance assessment: No apparent balance deficits (not formally assessed)                                          Cognition Arousal/Alertness: Awake/alert Behavior During Therapy: WFL for tasks assessed/performed Overall Cognitive Status: Within Functional Limits for tasks assessed                                        Exercises      General Comments        Pertinent Vitals/Pain Pain Assessment: No/denies pain    Home Living                      Prior Function            PT Goals (current goals can now be found in the care plan section) Acute Rehab PT Goals Patient Stated Goal: to get home tomorrow Progress towards PT goals: Progressing toward goals    Frequency    Min 3X/week      PT Plan Current plan remains appropriate    Co-evaluation              AM-PAC PT "6 Clicks" Daily Activity  Outcome Measure  Difficulty turning over in bed (including adjusting bedclothes, sheets and blankets)?:  None Difficulty moving from lying on back to sitting on the side of the bed? : None Difficulty sitting down on and standing up from a chair with arms (e.g., wheelchair, bedside commode, etc,.)?: None Help needed moving to and from a bed to chair (including a wheelchair)?: None Help needed walking in hospital room?: A Little Help needed climbing 3-5 steps with a railing? : A Little 6 Click Score: 22    End of Session Equipment Utilized During Treatment: Gait belt Activity Tolerance: Patient tolerated treatment well Patient left: in chair;with call bell/phone within reach;with family/visitor present Nurse Communication: Mobility status PT Visit Diagnosis: Difficulty in walking, not elsewhere classified (R26.2);Muscle weakness (generalized) (M62.81)     Time: 2330-0762 PT Time Calculation (min) (ACUTE ONLY): 20 min  Charges:  $Gait Training: 8-22 mins                    G Codes:       Scheryl Marten PT, DPT  [phone]    Jacqulyn Liner Sloan Leiter 08/03/2017, 10:00 AM

## 2017-08-03 NOTE — Progress Notes (Signed)
Discharge instructions and medications discussed with patient and spouse.  Prescriptions given to patient.  All questions answered.

## 2017-08-03 NOTE — Care Management Note (Signed)
Case Management Note  Patient Details  Name: Kimberly Huynh MRN: [id number] Date of Birth: 26-Oct-1951  Subjective/Objective:                 Paient admitted from home w spouse for N/V/D. GI Panel pending. CM will continue to follow.   Action/Plan:   Expected Discharge Date:  08/03/17               Expected Discharge Plan:  Home/Self Care  In-House Referral:     Discharge planning Services  CM Consult  Post Acute Care Choice:    Choice offered to:  Patient  DME Arranged:    DME Agency:     HH Arranged:  PT [place]:  Kimberly Huynh  Status of Service:  Completed, signed off  If discussed at [place] of Stay Meetings, dates discussed:    Additional Comments: 08/03/2017 Pt to discharge home today with husband. Pt is independent from home and is in agreement with HHPT - choice given and pt chose [facility] - agency contacted and referral accepted.  Pt has PCP and denies barriers to obtaining medications Kimberly Labrador, RN 08/03/2017, 9:52 AM

## 2017-08-05 DIAGNOSIS — I10 Essential (primary) hypertension: Secondary | ICD-10-CM | POA: Diagnosis not present

## 2017-08-05 DIAGNOSIS — E119 Type 2 diabetes mellitus without complications: Secondary | ICD-10-CM | POA: Diagnosis not present

## 2017-08-05 DIAGNOSIS — Z87891 Personal history of nicotine dependence: Secondary | ICD-10-CM | POA: Diagnosis not present

## 2017-08-05 DIAGNOSIS — K219 Gastro-esophageal reflux disease without esophagitis: Secondary | ICD-10-CM | POA: Diagnosis not present

## 2017-08-05 DIAGNOSIS — K529 Noninfective gastroenteritis and colitis, unspecified: Secondary | ICD-10-CM | POA: Diagnosis not present

## 2017-08-05 DIAGNOSIS — J189 Pneumonia, unspecified organism: Secondary | ICD-10-CM | POA: Diagnosis not present

## 2017-08-05 DIAGNOSIS — M810 Age-related osteoporosis without current pathological fracture: Secondary | ICD-10-CM | POA: Diagnosis not present

## 2017-08-05 DIAGNOSIS — F419 Anxiety disorder, unspecified: Secondary | ICD-10-CM | POA: Diagnosis not present

## 2017-08-05 DIAGNOSIS — M1990 Unspecified osteoarthritis, unspecified site: Secondary | ICD-10-CM | POA: Diagnosis not present

## 2017-08-05 DIAGNOSIS — E785 Hyperlipidemia, unspecified: Secondary | ICD-10-CM | POA: Diagnosis not present

## 2017-08-05 LAB — CULTURE, BLOOD (ROUTINE X 2)
CULTURE: NO GROWTH
Culture: NO GROWTH
SPECIAL REQUESTS: ADEQUATE
Special Requests: ADEQUATE

## 2017-08-07 DIAGNOSIS — F419 Anxiety disorder, unspecified: Secondary | ICD-10-CM | POA: Diagnosis not present

## 2017-08-07 DIAGNOSIS — E119 Type 2 diabetes mellitus without complications: Secondary | ICD-10-CM | POA: Diagnosis not present

## 2017-08-07 DIAGNOSIS — I10 Essential (primary) hypertension: Secondary | ICD-10-CM | POA: Diagnosis not present

## 2017-08-07 DIAGNOSIS — K219 Gastro-esophageal reflux disease without esophagitis: Secondary | ICD-10-CM | POA: Diagnosis not present

## 2017-08-07 DIAGNOSIS — J189 Pneumonia, unspecified organism: Secondary | ICD-10-CM | POA: Diagnosis not present

## 2017-08-07 DIAGNOSIS — K529 Noninfective gastroenteritis and colitis, unspecified: Secondary | ICD-10-CM | POA: Diagnosis not present

## 2017-08-08 ENCOUNTER — Other Ambulatory Visit: Payer: Self-pay | Admitting: *Deleted

## 2017-08-08 NOTE — Patient Outreach (Signed)
[facility]) Care Management  08/08/2017  Kimberly Huynh 20-Aug-1951 [id number]  EMMI- General Discharge RED ON EMMI ALERT DAY#: 1 DATE: 08/07/17 RED ALERT: Know who to call about changes in condition? No  Outreach attempt # 1, spoke with patient. Reviewed and addressed red alert. Patient confirmed, she did not know who to call about changes in her medical condition. Patient wasn't sure if she could call her PCP or the Hospitalist.  Patient informed to call her PCP with any questions or concerns. She agreed and her next PCP appointment is 08/16/17 at 9 am.   Patient stated, she hasn't had the opportunity to read her hospital discharge paperwork. She said, her and her husband will take the time to read it tonight. She reported, she has been concerned about getting her medications corrected. The Hospitalist made new changes to her medications.    Plan: RN CM will notify [facility] CM administrative assistant regarding case closure.   Kimberly Bells, RN, BSN, MHA/MSL, [place] Telephonic Care Manager Coordinator [facility] Direct Phone: [phone] Toll Free: [phone] Fax: [phone]

## 2017-08-09 DIAGNOSIS — E119 Type 2 diabetes mellitus without complications: Secondary | ICD-10-CM | POA: Diagnosis not present

## 2017-08-09 DIAGNOSIS — F419 Anxiety disorder, unspecified: Secondary | ICD-10-CM | POA: Diagnosis not present

## 2017-08-09 DIAGNOSIS — K219 Gastro-esophageal reflux disease without esophagitis: Secondary | ICD-10-CM | POA: Diagnosis not present

## 2017-08-09 DIAGNOSIS — K529 Noninfective gastroenteritis and colitis, unspecified: Secondary | ICD-10-CM | POA: Diagnosis not present

## 2017-08-09 DIAGNOSIS — I10 Essential (primary) hypertension: Secondary | ICD-10-CM | POA: Diagnosis not present

## 2017-08-09 DIAGNOSIS — J189 Pneumonia, unspecified organism: Secondary | ICD-10-CM | POA: Diagnosis not present

## 2017-08-12 DIAGNOSIS — K529 Noninfective gastroenteritis and colitis, unspecified: Secondary | ICD-10-CM | POA: Diagnosis not present

## 2017-08-12 DIAGNOSIS — K219 Gastro-esophageal reflux disease without esophagitis: Secondary | ICD-10-CM | POA: Diagnosis not present

## 2017-08-12 DIAGNOSIS — J189 Pneumonia, unspecified organism: Secondary | ICD-10-CM | POA: Diagnosis not present

## 2017-08-12 DIAGNOSIS — I10 Essential (primary) hypertension: Secondary | ICD-10-CM | POA: Diagnosis not present

## 2017-08-12 DIAGNOSIS — F419 Anxiety disorder, unspecified: Secondary | ICD-10-CM | POA: Diagnosis not present

## 2017-08-12 DIAGNOSIS — E119 Type 2 diabetes mellitus without complications: Secondary | ICD-10-CM | POA: Diagnosis not present

## 2017-08-14 DIAGNOSIS — F419 Anxiety disorder, unspecified: Secondary | ICD-10-CM | POA: Diagnosis not present

## 2017-08-14 DIAGNOSIS — K529 Noninfective gastroenteritis and colitis, unspecified: Secondary | ICD-10-CM | POA: Diagnosis not present

## 2017-08-14 DIAGNOSIS — E119 Type 2 diabetes mellitus without complications: Secondary | ICD-10-CM | POA: Diagnosis not present

## 2017-08-14 DIAGNOSIS — K219 Gastro-esophageal reflux disease without esophagitis: Secondary | ICD-10-CM | POA: Diagnosis not present

## 2017-08-14 DIAGNOSIS — J189 Pneumonia, unspecified organism: Secondary | ICD-10-CM | POA: Diagnosis not present

## 2017-08-14 DIAGNOSIS — I10 Essential (primary) hypertension: Secondary | ICD-10-CM | POA: Diagnosis not present

## 2017-08-16 DIAGNOSIS — R05 Cough: Secondary | ICD-10-CM | POA: Diagnosis not present

## 2017-08-16 DIAGNOSIS — Z6825 Body mass index (BMI) 25.0-25.9, adult: Secondary | ICD-10-CM | POA: Diagnosis not present

## 2017-08-16 DIAGNOSIS — N179 Acute kidney failure, unspecified: Secondary | ICD-10-CM | POA: Diagnosis not present

## 2017-08-16 DIAGNOSIS — J189 Pneumonia, unspecified organism: Secondary | ICD-10-CM | POA: Diagnosis not present

## 2017-08-16 DIAGNOSIS — K219 Gastro-esophageal reflux disease without esophagitis: Secondary | ICD-10-CM | POA: Diagnosis not present

## 2017-08-16 DIAGNOSIS — E869 Volume depletion, unspecified: Secondary | ICD-10-CM | POA: Diagnosis not present

## 2017-08-16 DIAGNOSIS — E119 Type 2 diabetes mellitus without complications: Secondary | ICD-10-CM | POA: Diagnosis not present

## 2017-08-16 DIAGNOSIS — I10 Essential (primary) hypertension: Secondary | ICD-10-CM | POA: Diagnosis not present

## 2017-08-16 DIAGNOSIS — F419 Anxiety disorder, unspecified: Secondary | ICD-10-CM | POA: Diagnosis not present

## 2017-08-16 DIAGNOSIS — J690 Pneumonitis due to inhalation of food and vomit: Secondary | ICD-10-CM | POA: Diagnosis not present

## 2017-08-16 DIAGNOSIS — K529 Noninfective gastroenteritis and colitis, unspecified: Secondary | ICD-10-CM | POA: Diagnosis not present

## 2017-08-16 DIAGNOSIS — E871 Hypo-osmolality and hyponatremia: Secondary | ICD-10-CM | POA: Diagnosis not present

## 2017-08-27 DIAGNOSIS — E119 Type 2 diabetes mellitus without complications: Secondary | ICD-10-CM | POA: Diagnosis not present

## 2017-08-27 DIAGNOSIS — B370 Candidal stomatitis: Secondary | ICD-10-CM | POA: Diagnosis not present

## 2017-08-27 DIAGNOSIS — K529 Noninfective gastroenteritis and colitis, unspecified: Secondary | ICD-10-CM | POA: Diagnosis not present

## 2017-08-27 DIAGNOSIS — Z6825 Body mass index (BMI) 25.0-25.9, adult: Secondary | ICD-10-CM | POA: Diagnosis not present

## 2017-09-09 DIAGNOSIS — R350 Frequency of micturition: Secondary | ICD-10-CM | POA: Diagnosis not present

## 2017-09-09 DIAGNOSIS — E119 Type 2 diabetes mellitus without complications: Secondary | ICD-10-CM | POA: Diagnosis not present

## 2017-09-09 DIAGNOSIS — N390 Urinary tract infection, site not specified: Secondary | ICD-10-CM | POA: Diagnosis not present

## 2017-09-09 DIAGNOSIS — B370 Candidal stomatitis: Secondary | ICD-10-CM | POA: Diagnosis not present

## 2017-09-09 DIAGNOSIS — Z6825 Body mass index (BMI) 25.0-25.9, adult: Secondary | ICD-10-CM | POA: Diagnosis not present

## 2017-09-30 DIAGNOSIS — I10 Essential (primary) hypertension: Secondary | ICD-10-CM | POA: Diagnosis not present

## 2017-09-30 DIAGNOSIS — K219 Gastro-esophageal reflux disease without esophagitis: Secondary | ICD-10-CM | POA: Diagnosis not present

## 2017-09-30 DIAGNOSIS — E871 Hypo-osmolality and hyponatremia: Secondary | ICD-10-CM | POA: Diagnosis not present

## 2017-09-30 DIAGNOSIS — Z23 Encounter for immunization: Secondary | ICD-10-CM | POA: Diagnosis not present

## 2017-09-30 DIAGNOSIS — Z6825 Body mass index (BMI) 25.0-25.9, adult: Secondary | ICD-10-CM | POA: Diagnosis not present

## 2017-10-10 DIAGNOSIS — H401131 Primary open-angle glaucoma, bilateral, mild stage: Secondary | ICD-10-CM | POA: Diagnosis not present

## 2017-10-21 DIAGNOSIS — K219 Gastro-esophageal reflux disease without esophagitis: Secondary | ICD-10-CM | POA: Diagnosis not present

## 2017-10-21 DIAGNOSIS — F419 Anxiety disorder, unspecified: Secondary | ICD-10-CM | POA: Diagnosis not present

## 2017-10-21 DIAGNOSIS — Z6825 Body mass index (BMI) 25.0-25.9, adult: Secondary | ICD-10-CM | POA: Diagnosis not present

## 2017-10-26 IMAGING — CT CT ABD-PELV W/O CM
2 of 4 series · 16 of 46 positions shown, 18 images · non-contrast
Comparison: Ultrasound the abdomen of 07/31/2017

CLINICAL DATA: Abdominal pain, nausea and vomiting for 1 week, some
diarrhea

EXAM:
CT ABDOMEN AND PELVIS WITHOUT CONTRAST
TECHNIQUE: Multidetector CT imaging of the abdomen and pelvis was performed
following the standard protocol without IV contrast.

[Series 3: abd/ pelvis 5.0 i30f 2 · axial · 0.67mm/px · z∈[-147,+268]mm · 13 of 91 slices shown, 15 images]
[im 4/91  soft-tissue]
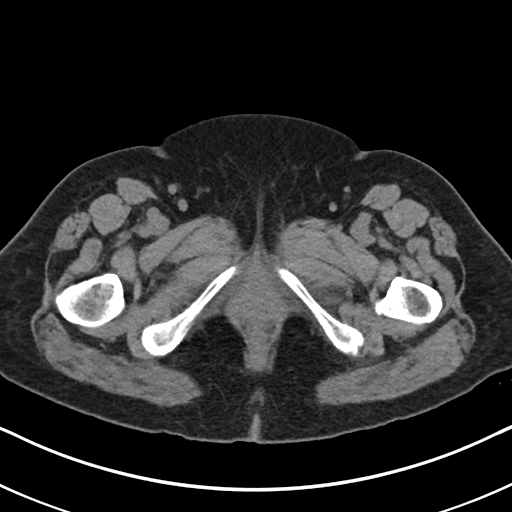
[im 4/91  bone]
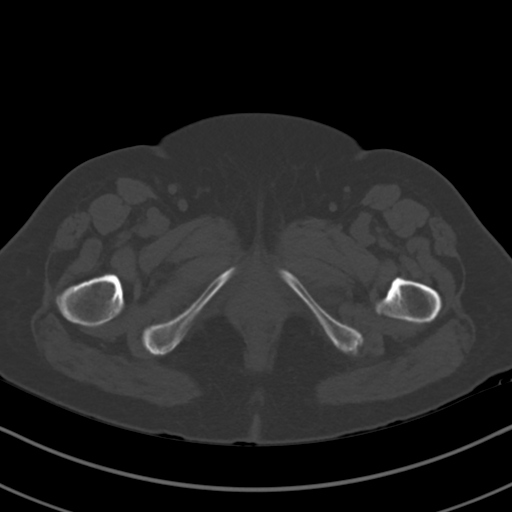
[im 12/91  soft-tissue]
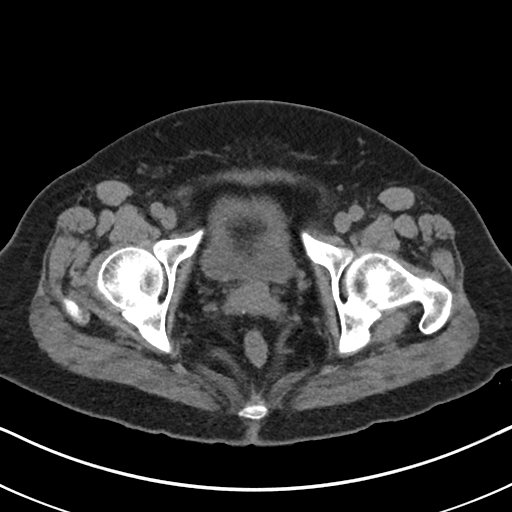
[im 19/91  soft-tissue]
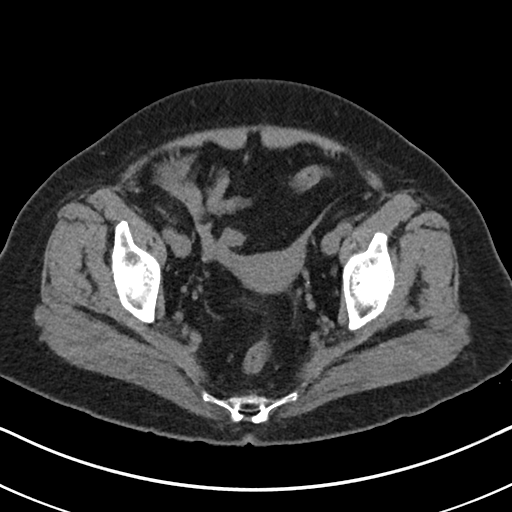
[im 27/91  soft-tissue]
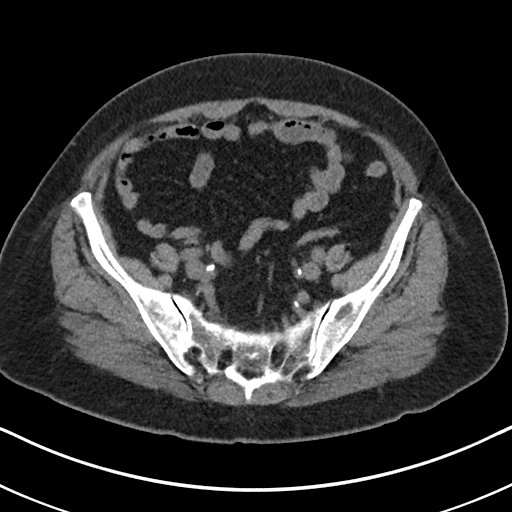
[im 31/91  soft-tissue]
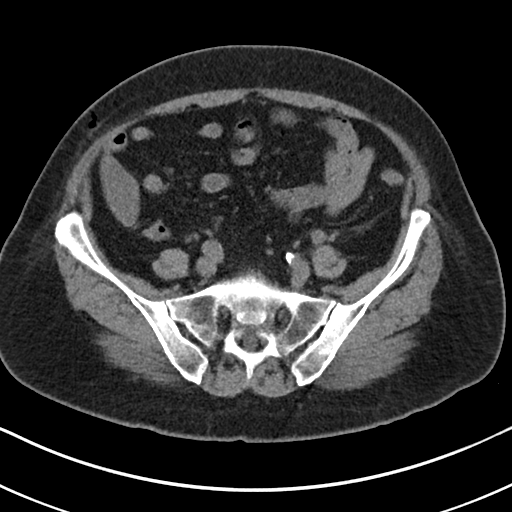
[im 38/91  soft-tissue]
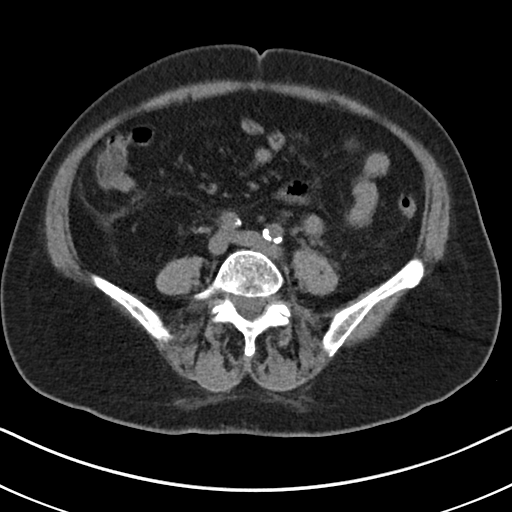
[im 46/91  soft-tissue]
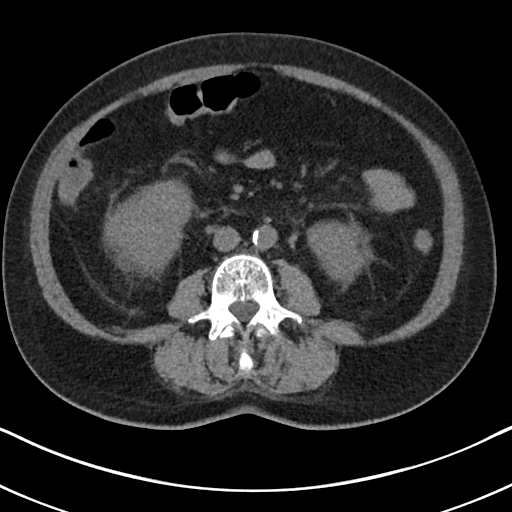
[im 53/91  soft-tissue]
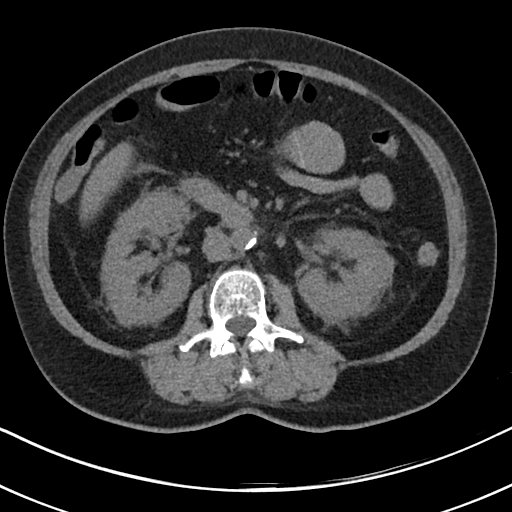
[im 61/91  soft-tissue]
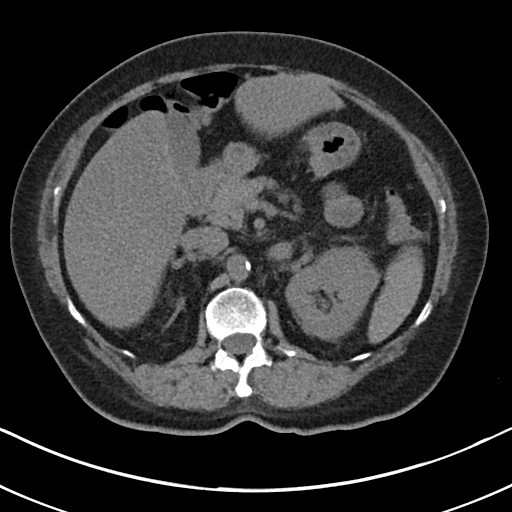
[im 61/91  bone]
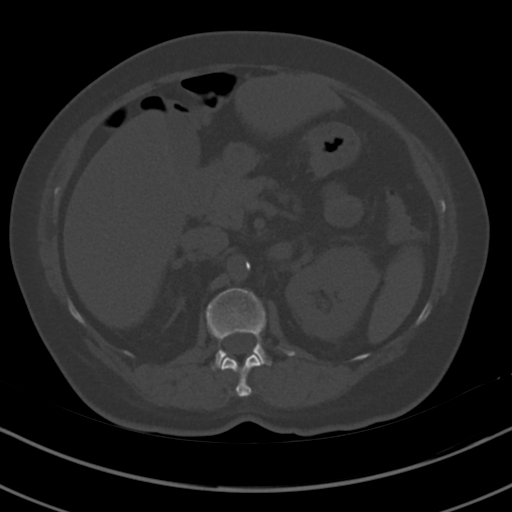
[im 64/91  soft-tissue]
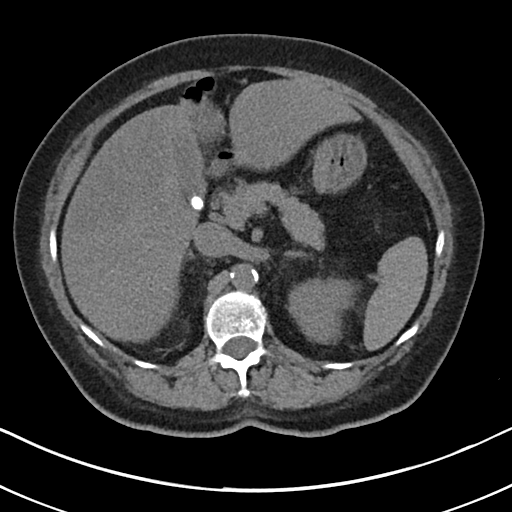
[im 72/91  soft-tissue]
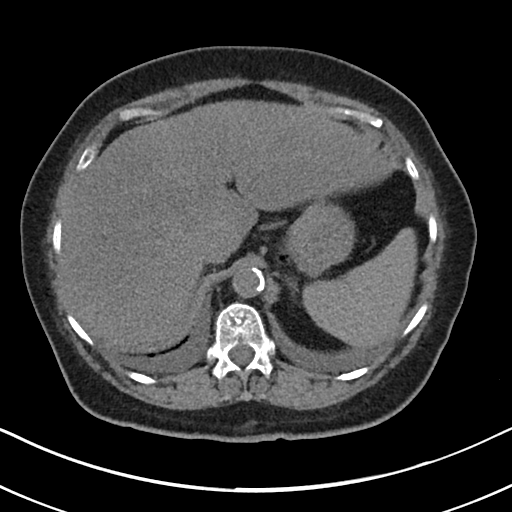
[im 79/91  soft-tissue]
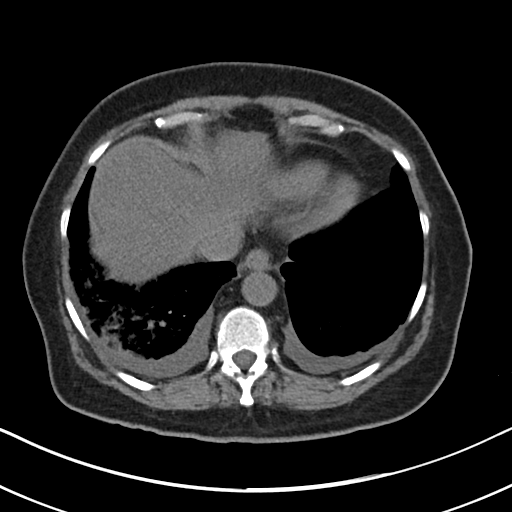
[im 87/91  soft-tissue]
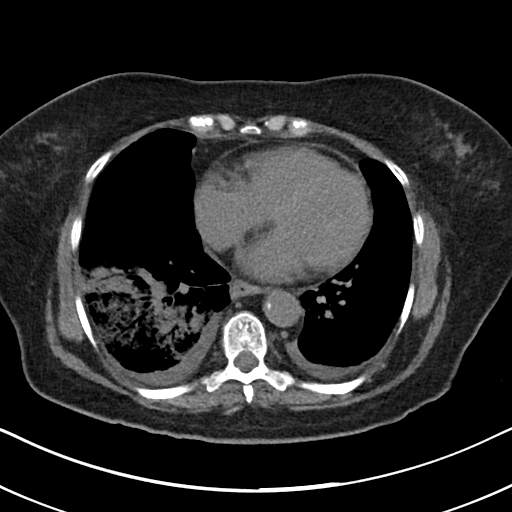

[Series 6: cor st · coronal · 0.70mm/px · 3 of 87 slices shown]
[im 29/87  soft-tissue]
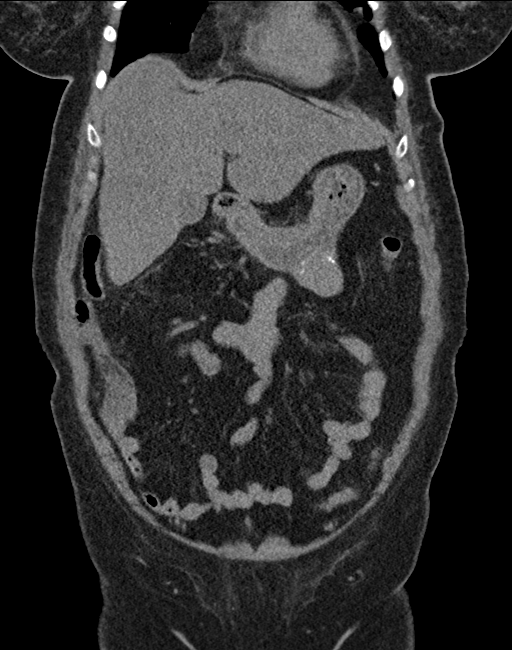
[im 39/87  soft-tissue]
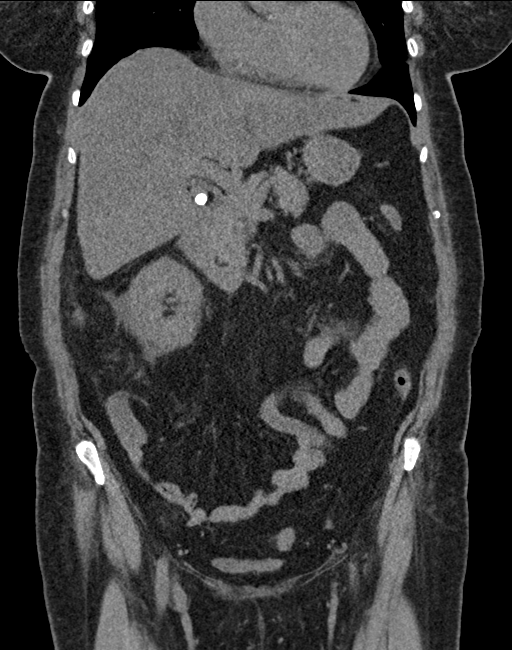
[im 48/87  soft-tissue]
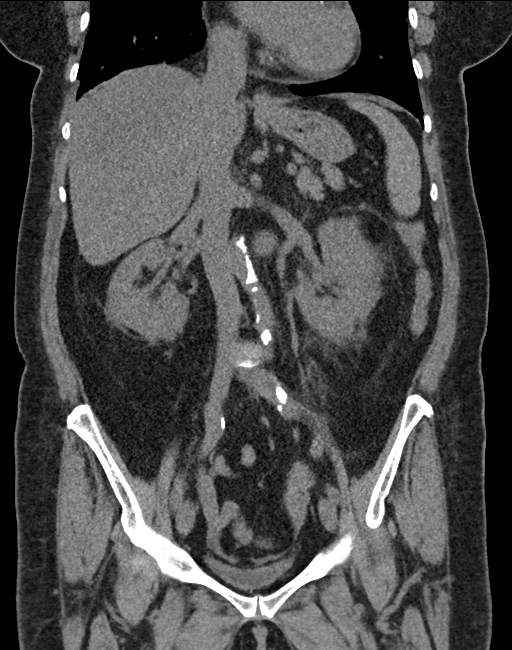

[16 of 46 positions shown; findings below may reference images not displayed]

FINDINGS: Lower chest: There is extensive parenchymal opacity within the right
lower lobe with air bronchograms most consistent with a right lower
lobe pneumonia. Small pleural effusions also were present. The heart
is within normal limits in size.

Hepatobiliary: The liver is low in attenuation consistent with
diffuse fatty infiltration. A gallstone is noted in the neck of the
gallbladder measuring 10 mm in diameter. No gallbladder distention
or gallbladder wall thickening is seen.

Pancreas: The pancreas is normal in size and the pancreatic duct is
not dilated.

Spleen: The spleen is unremarkable.

Adrenals/Urinary Tract: The adrenal glands appear normal. No renal
calculi are seen. There is no evidence of hydronephrosis. The
ureters are normal in caliber. The urinary bladder is decompressed
and cannot be evaluated.

Stomach/Bowel: The stomach is largely decompressed. No small bowel
distention is seen the colon is decompressed. The terminal ileum is
unremarkable. The appendix is not definitely visualized. No
inflammatory process is seen within the right lower quadrant.

Vascular/Lymphatic: The abdominal aorta is normal in caliber with
moderate abdominal aortic atherosclerosis present. No adenopathy is
seen.

Reproductive: The uterus is normal in size. No adnexal lesion is
seen. No fluid is noted within the pelvis.

Other: None.

Musculoskeletal: The lumbar vertebrae are in normal alignment.
Intervertebral disc spaces appear normal. The SI joints are
corticated.
IMPRESSION: 1. Right lower lobe pneumonia.  Small bilateral pleural effusions.
2. 10 mm gallstone in the neck of the gallbladder. No present CT
evidence of acute cholecystitis is seen. Correlate clinically.
3. Diffuse fatty infiltration of the liver.
4. Moderate abdominal aortic atherosclerosis.

## 2017-11-01 DIAGNOSIS — D1801 Hemangioma of skin and subcutaneous tissue: Secondary | ICD-10-CM | POA: Diagnosis not present

## 2017-11-01 DIAGNOSIS — L821 Other seborrheic keratosis: Secondary | ICD-10-CM | POA: Diagnosis not present

## 2017-11-26 DIAGNOSIS — Z1389 Encounter for screening for other disorder: Secondary | ICD-10-CM | POA: Diagnosis not present

## 2017-11-26 DIAGNOSIS — K219 Gastro-esophageal reflux disease without esophagitis: Secondary | ICD-10-CM | POA: Diagnosis not present

## 2017-11-26 DIAGNOSIS — F419 Anxiety disorder, unspecified: Secondary | ICD-10-CM | POA: Diagnosis not present

## 2017-11-26 DIAGNOSIS — I10 Essential (primary) hypertension: Secondary | ICD-10-CM | POA: Diagnosis not present

## 2017-11-26 DIAGNOSIS — Z418 Encounter for other procedures for purposes other than remedying health state: Secondary | ICD-10-CM | POA: Diagnosis not present

## 2017-11-26 DIAGNOSIS — L658 Other specified nonscarring hair loss: Secondary | ICD-10-CM | POA: Diagnosis not present

## 2017-11-26 DIAGNOSIS — Z6825 Body mass index (BMI) 25.0-25.9, adult: Secondary | ICD-10-CM | POA: Diagnosis not present

## 2017-11-26 DIAGNOSIS — E119 Type 2 diabetes mellitus without complications: Secondary | ICD-10-CM | POA: Diagnosis not present

## 2017-12-31 DIAGNOSIS — Z124 Encounter for screening for malignant neoplasm of cervix: Secondary | ICD-10-CM | POA: Diagnosis not present

## 2017-12-31 DIAGNOSIS — Z01419 Encounter for gynecological examination (general) (routine) without abnormal findings: Secondary | ICD-10-CM | POA: Diagnosis not present

## 2018-01-08 DIAGNOSIS — H401131 Primary open-angle glaucoma, bilateral, mild stage: Secondary | ICD-10-CM | POA: Diagnosis not present

## 2018-01-30 DIAGNOSIS — J111 Influenza due to unidentified influenza virus with other respiratory manifestations: Secondary | ICD-10-CM | POA: Diagnosis not present

## 2018-01-30 DIAGNOSIS — J019 Acute sinusitis, unspecified: Secondary | ICD-10-CM | POA: Diagnosis not present

## 2018-01-30 DIAGNOSIS — R05 Cough: Secondary | ICD-10-CM | POA: Diagnosis not present

## 2018-01-30 DIAGNOSIS — Z6825 Body mass index (BMI) 25.0-25.9, adult: Secondary | ICD-10-CM | POA: Diagnosis not present

## 2018-02-13 DIAGNOSIS — Z1231 Encounter for screening mammogram for malignant neoplasm of breast: Secondary | ICD-10-CM | POA: Diagnosis not present

## 2018-02-26 DIAGNOSIS — L650 Telogen effluvium: Secondary | ICD-10-CM | POA: Diagnosis not present

## 2018-02-26 DIAGNOSIS — B0089 Other herpesviral infection: Secondary | ICD-10-CM | POA: Diagnosis not present

## 2018-04-09 DIAGNOSIS — Z1389 Encounter for screening for other disorder: Secondary | ICD-10-CM | POA: Diagnosis not present

## 2018-04-09 DIAGNOSIS — K219 Gastro-esophageal reflux disease without esophagitis: Secondary | ICD-10-CM | POA: Diagnosis not present

## 2018-04-09 DIAGNOSIS — Z6826 Body mass index (BMI) 26.0-26.9, adult: Secondary | ICD-10-CM | POA: Diagnosis not present

## 2018-04-09 DIAGNOSIS — E119 Type 2 diabetes mellitus without complications: Secondary | ICD-10-CM | POA: Diagnosis not present

## 2018-04-09 DIAGNOSIS — J019 Acute sinusitis, unspecified: Secondary | ICD-10-CM | POA: Diagnosis not present

## 2018-04-09 DIAGNOSIS — F419 Anxiety disorder, unspecified: Secondary | ICD-10-CM | POA: Diagnosis not present

## 2018-04-09 DIAGNOSIS — H409 Unspecified glaucoma: Secondary | ICD-10-CM | POA: Diagnosis not present

## 2018-04-09 DIAGNOSIS — I10 Essential (primary) hypertension: Secondary | ICD-10-CM | POA: Diagnosis not present

## 2018-04-10 DIAGNOSIS — H401131 Primary open-angle glaucoma, bilateral, mild stage: Secondary | ICD-10-CM | POA: Diagnosis not present

## 2018-04-18 DIAGNOSIS — H25013 Cortical age-related cataract, bilateral: Secondary | ICD-10-CM | POA: Diagnosis not present

## 2018-04-18 DIAGNOSIS — H5213 Myopia, bilateral: Secondary | ICD-10-CM | POA: Diagnosis not present

## 2018-04-18 DIAGNOSIS — H401131 Primary open-angle glaucoma, bilateral, mild stage: Secondary | ICD-10-CM | POA: Diagnosis not present

## 2018-04-18 DIAGNOSIS — H2513 Age-related nuclear cataract, bilateral: Secondary | ICD-10-CM | POA: Diagnosis not present

## 2018-07-22 DIAGNOSIS — M810 Age-related osteoporosis without current pathological fracture: Secondary | ICD-10-CM | POA: Diagnosis not present

## 2018-07-22 DIAGNOSIS — E119 Type 2 diabetes mellitus without complications: Secondary | ICD-10-CM | POA: Diagnosis not present

## 2018-07-22 DIAGNOSIS — I10 Essential (primary) hypertension: Secondary | ICD-10-CM | POA: Diagnosis not present

## 2018-08-07 DIAGNOSIS — Z0000 Encounter for general adult medical examination without abnormal findings: Secondary | ICD-10-CM | POA: Diagnosis not present

## 2018-08-07 DIAGNOSIS — I5030 Unspecified diastolic (congestive) heart failure: Secondary | ICD-10-CM | POA: Diagnosis not present

## 2018-08-07 DIAGNOSIS — H4089 Other specified glaucoma: Secondary | ICD-10-CM | POA: Diagnosis not present

## 2018-08-07 DIAGNOSIS — I10 Essential (primary) hypertension: Secondary | ICD-10-CM | POA: Diagnosis not present

## 2018-08-07 DIAGNOSIS — F418 Other specified anxiety disorders: Secondary | ICD-10-CM | POA: Diagnosis not present

## 2018-08-07 DIAGNOSIS — Z6828 Body mass index (BMI) 28.0-28.9, adult: Secondary | ICD-10-CM | POA: Diagnosis not present

## 2018-08-07 DIAGNOSIS — Z23 Encounter for immunization: Secondary | ICD-10-CM | POA: Diagnosis not present

## 2018-08-07 DIAGNOSIS — Z1389 Encounter for screening for other disorder: Secondary | ICD-10-CM | POA: Diagnosis not present

## 2018-08-07 DIAGNOSIS — M810 Age-related osteoporosis without current pathological fracture: Secondary | ICD-10-CM | POA: Diagnosis not present

## 2018-08-07 DIAGNOSIS — K219 Gastro-esophageal reflux disease without esophagitis: Secondary | ICD-10-CM | POA: Diagnosis not present

## 2018-08-07 DIAGNOSIS — E782 Mixed hyperlipidemia: Secondary | ICD-10-CM | POA: Diagnosis not present

## 2018-08-07 DIAGNOSIS — D126 Benign neoplasm of colon, unspecified: Secondary | ICD-10-CM | POA: Diagnosis not present

## 2018-08-07 DIAGNOSIS — E119 Type 2 diabetes mellitus without complications: Secondary | ICD-10-CM | POA: Diagnosis not present

## 2018-08-07 DIAGNOSIS — R82998 Other abnormal findings in urine: Secondary | ICD-10-CM | POA: Diagnosis not present

## 2018-08-28 DIAGNOSIS — J302 Other seasonal allergic rhinitis: Secondary | ICD-10-CM | POA: Diagnosis not present

## 2018-08-28 DIAGNOSIS — J45909 Unspecified asthma, uncomplicated: Secondary | ICD-10-CM | POA: Diagnosis not present

## 2018-08-28 DIAGNOSIS — R05 Cough: Secondary | ICD-10-CM | POA: Diagnosis not present

## 2018-09-05 DIAGNOSIS — H401131 Primary open-angle glaucoma, bilateral, mild stage: Secondary | ICD-10-CM | POA: Diagnosis not present

## 2018-09-11 DIAGNOSIS — H401131 Primary open-angle glaucoma, bilateral, mild stage: Secondary | ICD-10-CM | POA: Diagnosis not present

## 2018-09-26 DIAGNOSIS — R05 Cough: Secondary | ICD-10-CM | POA: Diagnosis not present

## 2018-09-26 DIAGNOSIS — J309 Allergic rhinitis, unspecified: Secondary | ICD-10-CM | POA: Diagnosis not present

## 2018-12-16 DIAGNOSIS — F411 Generalized anxiety disorder: Secondary | ICD-10-CM | POA: Diagnosis not present

## 2018-12-16 DIAGNOSIS — F4323 Adjustment disorder with mixed anxiety and depressed mood: Secondary | ICD-10-CM | POA: Diagnosis not present

## 2018-12-23 DIAGNOSIS — F4323 Adjustment disorder with mixed anxiety and depressed mood: Secondary | ICD-10-CM | POA: Diagnosis not present

## 2018-12-23 DIAGNOSIS — F411 Generalized anxiety disorder: Secondary | ICD-10-CM | POA: Diagnosis not present

## 2018-12-30 DIAGNOSIS — I10 Essential (primary) hypertension: Secondary | ICD-10-CM | POA: Diagnosis not present

## 2018-12-30 DIAGNOSIS — Z6827 Body mass index (BMI) 27.0-27.9, adult: Secondary | ICD-10-CM | POA: Diagnosis not present

## 2018-12-30 DIAGNOSIS — K219 Gastro-esophageal reflux disease without esophagitis: Secondary | ICD-10-CM | POA: Diagnosis not present

## 2018-12-30 DIAGNOSIS — F418 Other specified anxiety disorders: Secondary | ICD-10-CM | POA: Diagnosis not present

## 2018-12-30 DIAGNOSIS — J302 Other seasonal allergic rhinitis: Secondary | ICD-10-CM | POA: Diagnosis not present

## 2018-12-30 DIAGNOSIS — E119 Type 2 diabetes mellitus without complications: Secondary | ICD-10-CM | POA: Diagnosis not present

## 2019-01-08 DIAGNOSIS — F411 Generalized anxiety disorder: Secondary | ICD-10-CM | POA: Diagnosis not present

## 2019-01-08 DIAGNOSIS — F4323 Adjustment disorder with mixed anxiety and depressed mood: Secondary | ICD-10-CM | POA: Diagnosis not present

## 2019-01-13 DIAGNOSIS — Z124 Encounter for screening for malignant neoplasm of cervix: Secondary | ICD-10-CM | POA: Diagnosis not present

## 2019-01-13 DIAGNOSIS — Z01419 Encounter for gynecological examination (general) (routine) without abnormal findings: Secondary | ICD-10-CM | POA: Diagnosis not present

## 2019-01-19 DIAGNOSIS — H401131 Primary open-angle glaucoma, bilateral, mild stage: Secondary | ICD-10-CM | POA: Diagnosis not present

## 2019-01-27 DIAGNOSIS — F4323 Adjustment disorder with mixed anxiety and depressed mood: Secondary | ICD-10-CM | POA: Diagnosis not present

## 2019-01-27 DIAGNOSIS — F411 Generalized anxiety disorder: Secondary | ICD-10-CM | POA: Diagnosis not present

## 2019-02-10 DIAGNOSIS — F411 Generalized anxiety disorder: Secondary | ICD-10-CM | POA: Diagnosis not present

## 2019-02-10 DIAGNOSIS — L820 Inflamed seborrheic keratosis: Secondary | ICD-10-CM | POA: Diagnosis not present

## 2019-02-10 DIAGNOSIS — F4323 Adjustment disorder with mixed anxiety and depressed mood: Secondary | ICD-10-CM | POA: Diagnosis not present

## 2019-02-10 DIAGNOSIS — L821 Other seborrheic keratosis: Secondary | ICD-10-CM | POA: Diagnosis not present

## 2019-02-16 DIAGNOSIS — F4323 Adjustment disorder with mixed anxiety and depressed mood: Secondary | ICD-10-CM | POA: Diagnosis not present

## 2019-02-16 DIAGNOSIS — F411 Generalized anxiety disorder: Secondary | ICD-10-CM | POA: Diagnosis not present

## 2019-04-28 DIAGNOSIS — E119 Type 2 diabetes mellitus without complications: Secondary | ICD-10-CM | POA: Diagnosis not present

## 2019-04-29 DIAGNOSIS — F419 Anxiety disorder, unspecified: Secondary | ICD-10-CM | POA: Diagnosis not present

## 2019-04-29 DIAGNOSIS — M79646 Pain in unspecified finger(s): Secondary | ICD-10-CM | POA: Diagnosis not present

## 2019-04-29 DIAGNOSIS — K219 Gastro-esophageal reflux disease without esophagitis: Secondary | ICD-10-CM | POA: Diagnosis not present

## 2019-04-29 DIAGNOSIS — I10 Essential (primary) hypertension: Secondary | ICD-10-CM | POA: Diagnosis not present

## 2019-04-29 DIAGNOSIS — E119 Type 2 diabetes mellitus without complications: Secondary | ICD-10-CM | POA: Diagnosis not present

## 2019-04-29 DIAGNOSIS — J302 Other seasonal allergic rhinitis: Secondary | ICD-10-CM | POA: Diagnosis not present

## 2019-06-08 DIAGNOSIS — Z803 Family history of malignant neoplasm of breast: Secondary | ICD-10-CM | POA: Diagnosis not present

## 2019-06-08 DIAGNOSIS — Z1231 Encounter for screening mammogram for malignant neoplasm of breast: Secondary | ICD-10-CM | POA: Diagnosis not present

## 2019-06-09 DIAGNOSIS — R05 Cough: Secondary | ICD-10-CM | POA: Diagnosis not present

## 2019-06-09 DIAGNOSIS — J300 Vasomotor rhinitis: Secondary | ICD-10-CM | POA: Diagnosis not present

## 2019-07-06 DIAGNOSIS — H401131 Primary open-angle glaucoma, bilateral, mild stage: Secondary | ICD-10-CM | POA: Diagnosis not present

## 2019-07-21 DIAGNOSIS — H0011 Chalazion right upper eyelid: Secondary | ICD-10-CM | POA: Diagnosis not present

## 2019-07-28 DIAGNOSIS — W5501XA Bitten by cat, initial encounter: Secondary | ICD-10-CM | POA: Diagnosis not present

## 2019-07-28 DIAGNOSIS — L0889 Other specified local infections of the skin and subcutaneous tissue: Secondary | ICD-10-CM | POA: Diagnosis not present

## 2019-07-28 DIAGNOSIS — I10 Essential (primary) hypertension: Secondary | ICD-10-CM | POA: Diagnosis not present

## 2019-07-28 DIAGNOSIS — H0011 Chalazion right upper eyelid: Secondary | ICD-10-CM | POA: Diagnosis not present

## 2019-07-29 DIAGNOSIS — Z23 Encounter for immunization: Secondary | ICD-10-CM | POA: Diagnosis not present

## 2019-08-06 DIAGNOSIS — M810 Age-related osteoporosis without current pathological fracture: Secondary | ICD-10-CM | POA: Diagnosis not present

## 2019-08-25 DIAGNOSIS — E782 Mixed hyperlipidemia: Secondary | ICD-10-CM | POA: Diagnosis not present

## 2019-08-25 DIAGNOSIS — I130 Hypertensive heart and chronic kidney disease with heart failure and stage 1 through stage 4 chronic kidney disease, or unspecified chronic kidney disease: Secondary | ICD-10-CM | POA: Diagnosis not present

## 2019-08-25 DIAGNOSIS — M810 Age-related osteoporosis without current pathological fracture: Secondary | ICD-10-CM | POA: Diagnosis not present

## 2019-08-25 DIAGNOSIS — E119 Type 2 diabetes mellitus without complications: Secondary | ICD-10-CM | POA: Diagnosis not present

## 2019-08-27 DIAGNOSIS — I10 Essential (primary) hypertension: Secondary | ICD-10-CM | POA: Diagnosis not present

## 2019-08-27 DIAGNOSIS — R82998 Other abnormal findings in urine: Secondary | ICD-10-CM | POA: Diagnosis not present

## 2019-08-29 DIAGNOSIS — Z23 Encounter for immunization: Secondary | ICD-10-CM | POA: Diagnosis not present

## 2019-08-31 DIAGNOSIS — E782 Mixed hyperlipidemia: Secondary | ICD-10-CM | POA: Diagnosis not present

## 2019-08-31 DIAGNOSIS — H409 Unspecified glaucoma: Secondary | ICD-10-CM | POA: Diagnosis not present

## 2019-08-31 DIAGNOSIS — I130 Hypertensive heart and chronic kidney disease with heart failure and stage 1 through stage 4 chronic kidney disease, or unspecified chronic kidney disease: Secondary | ICD-10-CM | POA: Diagnosis not present

## 2019-08-31 DIAGNOSIS — I5030 Unspecified diastolic (congestive) heart failure: Secondary | ICD-10-CM | POA: Diagnosis not present

## 2019-08-31 DIAGNOSIS — D126 Benign neoplasm of colon, unspecified: Secondary | ICD-10-CM | POA: Diagnosis not present

## 2019-08-31 DIAGNOSIS — F419 Anxiety disorder, unspecified: Secondary | ICD-10-CM | POA: Diagnosis not present

## 2019-08-31 DIAGNOSIS — N183 Chronic kidney disease, stage 3 (moderate): Secondary | ICD-10-CM | POA: Diagnosis not present

## 2019-08-31 DIAGNOSIS — M810 Age-related osteoporosis without current pathological fracture: Secondary | ICD-10-CM | POA: Diagnosis not present

## 2019-08-31 DIAGNOSIS — Z0000 Encounter for general adult medical examination without abnormal findings: Secondary | ICD-10-CM | POA: Diagnosis not present

## 2019-08-31 DIAGNOSIS — E119 Type 2 diabetes mellitus without complications: Secondary | ICD-10-CM | POA: Diagnosis not present

## 2019-08-31 DIAGNOSIS — J302 Other seasonal allergic rhinitis: Secondary | ICD-10-CM | POA: Diagnosis not present

## 2019-08-31 DIAGNOSIS — K219 Gastro-esophageal reflux disease without esophagitis: Secondary | ICD-10-CM | POA: Diagnosis not present

## 2019-09-02 DIAGNOSIS — Z1212 Encounter for screening for malignant neoplasm of rectum: Secondary | ICD-10-CM | POA: Diagnosis not present

## 2019-09-03 DIAGNOSIS — R197 Diarrhea, unspecified: Secondary | ICD-10-CM | POA: Diagnosis not present

## 2019-09-04 DIAGNOSIS — R197 Diarrhea, unspecified: Secondary | ICD-10-CM | POA: Diagnosis not present

## 2019-09-10 DIAGNOSIS — H401131 Primary open-angle glaucoma, bilateral, mild stage: Secondary | ICD-10-CM | POA: Diagnosis not present

## 2019-09-23 DIAGNOSIS — H5213 Myopia, bilateral: Secondary | ICD-10-CM | POA: Diagnosis not present

## 2019-09-23 DIAGNOSIS — H401131 Primary open-angle glaucoma, bilateral, mild stage: Secondary | ICD-10-CM | POA: Diagnosis not present

## 2019-09-23 DIAGNOSIS — H2513 Age-related nuclear cataract, bilateral: Secondary | ICD-10-CM | POA: Diagnosis not present

## 2019-09-23 DIAGNOSIS — E119 Type 2 diabetes mellitus without complications: Secondary | ICD-10-CM | POA: Diagnosis not present

## 2019-09-28 DIAGNOSIS — H401131 Primary open-angle glaucoma, bilateral, mild stage: Secondary | ICD-10-CM | POA: Diagnosis not present

## 2019-11-18 DIAGNOSIS — L570 Actinic keratosis: Secondary | ICD-10-CM | POA: Diagnosis not present

## 2019-11-18 DIAGNOSIS — L821 Other seborrheic keratosis: Secondary | ICD-10-CM | POA: Diagnosis not present

## 2019-12-30 DIAGNOSIS — E119 Type 2 diabetes mellitus without complications: Secondary | ICD-10-CM | POA: Diagnosis not present

## 2020-01-01 DIAGNOSIS — K219 Gastro-esophageal reflux disease without esophagitis: Secondary | ICD-10-CM | POA: Diagnosis not present

## 2020-01-01 DIAGNOSIS — R197 Diarrhea, unspecified: Secondary | ICD-10-CM | POA: Diagnosis not present

## 2020-01-01 DIAGNOSIS — E119 Type 2 diabetes mellitus without complications: Secondary | ICD-10-CM | POA: Diagnosis not present

## 2020-01-01 DIAGNOSIS — Z1331 Encounter for screening for depression: Secondary | ICD-10-CM | POA: Diagnosis not present

## 2020-01-01 DIAGNOSIS — N1831 Chronic kidney disease, stage 3a: Secondary | ICD-10-CM | POA: Diagnosis not present

## 2020-01-01 DIAGNOSIS — F419 Anxiety disorder, unspecified: Secondary | ICD-10-CM | POA: Diagnosis not present

## 2020-01-01 DIAGNOSIS — I130 Hypertensive heart and chronic kidney disease with heart failure and stage 1 through stage 4 chronic kidney disease, or unspecified chronic kidney disease: Secondary | ICD-10-CM | POA: Diagnosis not present

## 2020-01-13 DIAGNOSIS — E119 Type 2 diabetes mellitus without complications: Secondary | ICD-10-CM | POA: Diagnosis not present

## 2020-01-25 DIAGNOSIS — F4323 Adjustment disorder with mixed anxiety and depressed mood: Secondary | ICD-10-CM | POA: Diagnosis not present

## 2020-01-25 DIAGNOSIS — F411 Generalized anxiety disorder: Secondary | ICD-10-CM | POA: Diagnosis not present

## 2020-02-15 DIAGNOSIS — D1801 Hemangioma of skin and subcutaneous tissue: Secondary | ICD-10-CM | POA: Diagnosis not present

## 2020-02-15 DIAGNOSIS — D2271 Melanocytic nevi of right lower limb, including hip: Secondary | ICD-10-CM | POA: Diagnosis not present

## 2020-02-15 DIAGNOSIS — L821 Other seborrheic keratosis: Secondary | ICD-10-CM | POA: Diagnosis not present

## 2020-02-15 DIAGNOSIS — L820 Inflamed seborrheic keratosis: Secondary | ICD-10-CM | POA: Diagnosis not present

## 2020-03-14 DIAGNOSIS — H401131 Primary open-angle glaucoma, bilateral, mild stage: Secondary | ICD-10-CM | POA: Diagnosis not present

## 2020-04-26 DIAGNOSIS — F419 Anxiety disorder, unspecified: Secondary | ICD-10-CM | POA: Diagnosis not present

## 2020-04-26 DIAGNOSIS — N1831 Chronic kidney disease, stage 3a: Secondary | ICD-10-CM | POA: Diagnosis not present

## 2020-04-26 DIAGNOSIS — K219 Gastro-esophageal reflux disease without esophagitis: Secondary | ICD-10-CM | POA: Diagnosis not present

## 2020-04-26 DIAGNOSIS — E782 Mixed hyperlipidemia: Secondary | ICD-10-CM | POA: Diagnosis not present

## 2020-04-26 DIAGNOSIS — I130 Hypertensive heart and chronic kidney disease with heart failure and stage 1 through stage 4 chronic kidney disease, or unspecified chronic kidney disease: Secondary | ICD-10-CM | POA: Diagnosis not present

## 2020-04-26 DIAGNOSIS — I5030 Unspecified diastolic (congestive) heart failure: Secondary | ICD-10-CM | POA: Diagnosis not present

## 2020-04-26 DIAGNOSIS — Z1331 Encounter for screening for depression: Secondary | ICD-10-CM | POA: Diagnosis not present

## 2020-04-26 DIAGNOSIS — E119 Type 2 diabetes mellitus without complications: Secondary | ICD-10-CM | POA: Diagnosis not present

## 2020-05-23 DIAGNOSIS — H401131 Primary open-angle glaucoma, bilateral, mild stage: Secondary | ICD-10-CM | POA: Diagnosis not present

## 2020-05-27 DIAGNOSIS — J302 Other seasonal allergic rhinitis: Secondary | ICD-10-CM | POA: Diagnosis not present

## 2020-05-27 DIAGNOSIS — N1831 Chronic kidney disease, stage 3a: Secondary | ICD-10-CM | POA: Diagnosis not present

## 2020-05-27 DIAGNOSIS — F419 Anxiety disorder, unspecified: Secondary | ICD-10-CM | POA: Diagnosis not present

## 2020-05-27 DIAGNOSIS — I130 Hypertensive heart and chronic kidney disease with heart failure and stage 1 through stage 4 chronic kidney disease, or unspecified chronic kidney disease: Secondary | ICD-10-CM | POA: Diagnosis not present

## 2020-05-27 DIAGNOSIS — I5030 Unspecified diastolic (congestive) heart failure: Secondary | ICD-10-CM | POA: Diagnosis not present

## 2020-05-27 DIAGNOSIS — E782 Mixed hyperlipidemia: Secondary | ICD-10-CM | POA: Diagnosis not present

## 2020-06-29 DIAGNOSIS — Z1231 Encounter for screening mammogram for malignant neoplasm of breast: Secondary | ICD-10-CM | POA: Diagnosis not present

## 2020-07-19 DIAGNOSIS — H401131 Primary open-angle glaucoma, bilateral, mild stage: Secondary | ICD-10-CM | POA: Diagnosis not present

## 2020-08-11 DIAGNOSIS — Z124 Encounter for screening for malignant neoplasm of cervix: Secondary | ICD-10-CM | POA: Diagnosis not present

## 2020-08-11 DIAGNOSIS — Z01419 Encounter for gynecological examination (general) (routine) without abnormal findings: Secondary | ICD-10-CM | POA: Diagnosis not present

## 2020-08-29 DIAGNOSIS — H401131 Primary open-angle glaucoma, bilateral, mild stage: Secondary | ICD-10-CM | POA: Diagnosis not present

## 2020-09-06 DIAGNOSIS — Z20822 Contact with and (suspected) exposure to covid-19: Secondary | ICD-10-CM | POA: Diagnosis not present

## 2020-09-09 DIAGNOSIS — Z23 Encounter for immunization: Secondary | ICD-10-CM | POA: Diagnosis not present

## 2020-09-26 DIAGNOSIS — H5213 Myopia, bilateral: Secondary | ICD-10-CM | POA: Diagnosis not present

## 2020-09-26 DIAGNOSIS — H401131 Primary open-angle glaucoma, bilateral, mild stage: Secondary | ICD-10-CM | POA: Diagnosis not present

## 2020-09-26 DIAGNOSIS — E119 Type 2 diabetes mellitus without complications: Secondary | ICD-10-CM | POA: Diagnosis not present

## 2020-09-29 DIAGNOSIS — E119 Type 2 diabetes mellitus without complications: Secondary | ICD-10-CM | POA: Diagnosis not present

## 2020-09-29 DIAGNOSIS — E782 Mixed hyperlipidemia: Secondary | ICD-10-CM | POA: Diagnosis not present

## 2020-09-29 DIAGNOSIS — M810 Age-related osteoporosis without current pathological fracture: Secondary | ICD-10-CM | POA: Diagnosis not present

## 2020-10-06 DIAGNOSIS — R82998 Other abnormal findings in urine: Secondary | ICD-10-CM | POA: Diagnosis not present

## 2020-10-06 DIAGNOSIS — F419 Anxiety disorder, unspecified: Secondary | ICD-10-CM | POA: Diagnosis not present

## 2020-10-06 DIAGNOSIS — J302 Other seasonal allergic rhinitis: Secondary | ICD-10-CM | POA: Diagnosis not present

## 2020-10-06 DIAGNOSIS — Z0000 Encounter for general adult medical examination without abnormal findings: Secondary | ICD-10-CM | POA: Diagnosis not present

## 2020-10-06 DIAGNOSIS — M810 Age-related osteoporosis without current pathological fracture: Secondary | ICD-10-CM | POA: Diagnosis not present

## 2020-10-06 DIAGNOSIS — E119 Type 2 diabetes mellitus without complications: Secondary | ICD-10-CM | POA: Diagnosis not present

## 2020-10-06 DIAGNOSIS — I5030 Unspecified diastolic (congestive) heart failure: Secondary | ICD-10-CM | POA: Diagnosis not present

## 2020-10-06 DIAGNOSIS — H409 Unspecified glaucoma: Secondary | ICD-10-CM | POA: Diagnosis not present

## 2020-10-06 DIAGNOSIS — N1831 Chronic kidney disease, stage 3a: Secondary | ICD-10-CM | POA: Diagnosis not present

## 2020-10-06 DIAGNOSIS — Z23 Encounter for immunization: Secondary | ICD-10-CM | POA: Diagnosis not present

## 2020-10-06 DIAGNOSIS — K219 Gastro-esophageal reflux disease without esophagitis: Secondary | ICD-10-CM | POA: Diagnosis not present

## 2020-10-06 DIAGNOSIS — I130 Hypertensive heart and chronic kidney disease with heart failure and stage 1 through stage 4 chronic kidney disease, or unspecified chronic kidney disease: Secondary | ICD-10-CM | POA: Diagnosis not present

## 2020-10-06 DIAGNOSIS — E782 Mixed hyperlipidemia: Secondary | ICD-10-CM | POA: Diagnosis not present

## 2020-12-05 DIAGNOSIS — I130 Hypertensive heart and chronic kidney disease with heart failure and stage 1 through stage 4 chronic kidney disease, or unspecified chronic kidney disease: Secondary | ICD-10-CM | POA: Diagnosis not present

## 2020-12-05 DIAGNOSIS — E119 Type 2 diabetes mellitus without complications: Secondary | ICD-10-CM | POA: Diagnosis not present

## 2020-12-05 DIAGNOSIS — Z1152 Encounter for screening for COVID-19: Secondary | ICD-10-CM | POA: Diagnosis not present

## 2020-12-05 DIAGNOSIS — R059 Cough, unspecified: Secondary | ICD-10-CM | POA: Diagnosis not present

## 2020-12-05 DIAGNOSIS — J029 Acute pharyngitis, unspecified: Secondary | ICD-10-CM | POA: Diagnosis not present

## 2020-12-05 DIAGNOSIS — U071 COVID-19: Secondary | ICD-10-CM | POA: Diagnosis not present

## 2020-12-05 DIAGNOSIS — R6883 Chills (without fever): Secondary | ICD-10-CM | POA: Diagnosis not present

## 2020-12-05 DIAGNOSIS — I5030 Unspecified diastolic (congestive) heart failure: Secondary | ICD-10-CM | POA: Diagnosis not present

## 2020-12-05 DIAGNOSIS — N1831 Chronic kidney disease, stage 3a: Secondary | ICD-10-CM | POA: Diagnosis not present

## 2020-12-05 DIAGNOSIS — E782 Mixed hyperlipidemia: Secondary | ICD-10-CM | POA: Diagnosis not present

## 2020-12-06 ENCOUNTER — Other Ambulatory Visit: Payer: Self-pay | Admitting: Internal Medicine

## 2020-12-06 DIAGNOSIS — U071 COVID-19: Secondary | ICD-10-CM

## 2020-12-06 DIAGNOSIS — E1169 Type 2 diabetes mellitus with other specified complication: Secondary | ICD-10-CM

## 2020-12-06 DIAGNOSIS — I10 Essential (primary) hypertension: Secondary | ICD-10-CM

## 2020-12-06 NOTE — Progress Notes (Signed)
I connected by phone with Kimberly Huynh on 12/06/2020 at 3:30 PM to discuss the potential use of a new treatment for mild to moderate COVID-19 viral infection in non-hospitalized patients.  This patient is a 69 y.o. female that meets the FDA criteria for Emergency Use Authorization of COVID monoclonal antibody casirivimab/imdevimab, bamlanivimab/etesevimab, or sotrovimab.  Has a (+) direct SARS-CoV-2 viral test result  Has mild or moderate COVID-19   Is NOT hospitalized due to COVID-19  Is within 10 days of symptom onset  Has at least one of the high risk factor(s) for progression to severe COVID-19 and/or hospitalization as defined in EUA.  Specific high risk criteria : Older age (>/= 71 yo)   I have spoken and communicated the following to the patient or parent/caregiver regarding COVID monoclonal antibody treatment:  1. FDA has authorized the emergency use for the treatment of mild to moderate COVID-19 in adults and pediatric patients with positive results of direct SARS-CoV-2 viral testing who are 3 years of age and older weighing at least 40 kg, and who are at high risk for progressing to severe COVID-19 and/or hospitalization.  2. The significant known and potential risks and benefits of COVID monoclonal antibody, and the extent to which such potential risks and benefits are unknown.  3. Information on available alternative treatments and the risks and benefits of those alternatives, including clinical trials.  4. Patients treated with COVID monoclonal antibody should continue to self-isolate and use infection control measures (e.g., wear mask, isolate, social distance, avoid sharing personal items, clean and disinfect "high touch" surfaces, and frequent handwashing) according to CDC guidelines.   5. The patient or parent/caregiver has the option to accept or refuse COVID monoclonal antibody treatment.  After reviewing this information with the patient, the patient has agreed to  receive one of the available covid 19 monoclonal antibodies and will be provided an appropriate fact sheet prior to infusion.  [name], [place]

## 2020-12-07 ENCOUNTER — Ambulatory Visit (HOSPITAL_COMMUNITY)
Admission: RE | Admit: 2020-12-07 | Discharge: 2020-12-07 | Disposition: A | Discharge status: Home / Self Care | Payer: Medicare Other | Source: Ambulatory Visit | Attending: Pulmonary Disease | Admitting: Pulmonary Disease

## 2020-12-07 ENCOUNTER — Other Ambulatory Visit (HOSPITAL_COMMUNITY): Payer: Self-pay | Admitting: Pulmonary Disease

## 2020-12-07 DIAGNOSIS — U071 COVID-19: Secondary | ICD-10-CM | POA: Diagnosis not present

## 2020-12-07 DIAGNOSIS — Z23 Encounter for immunization: Secondary | ICD-10-CM | POA: Diagnosis not present

## 2020-12-07 DIAGNOSIS — E1169 Type 2 diabetes mellitus with other specified complication: Secondary | ICD-10-CM

## 2020-12-07 DIAGNOSIS — I10 Essential (primary) hypertension: Secondary | ICD-10-CM | POA: Diagnosis not present

## 2020-12-07 MED ORDER — METHYLPREDNISOLONE SODIUM SUCC 125 MG IJ SOLR: 125.0000 mg | Freq: Once | INTRAMUSCULAR | Status: DC | PRN

## 2020-12-07 MED ORDER — DIPHENHYDRAMINE HCL 50 MG/ML IJ SOLN: 50.0000 mg | Freq: Once | INTRAMUSCULAR | Status: DC | PRN

## 2020-12-07 MED ORDER — ONDANSETRON HCL 4 MG/2ML IJ SOLN
4.0000 mg | Freq: Once | INTRAMUSCULAR | Status: AC
  Administered 2020-12-07: 13:00:00 4 mg via INTRAVENOUS
  Filled 2020-12-07: qty 2

## 2020-12-07 MED ORDER — ALBUTEROL SULFATE HFA 108 (90 BASE) MCG/ACT IN AERS: 2.0000 | INHALATION_SPRAY | Freq: Once | RESPIRATORY_TRACT | Status: DC | PRN

## 2020-12-07 MED ORDER — EPINEPHRINE 0.3 MG/0.3ML IJ SOAJ: 0.3000 mg | Freq: Once | INTRAMUSCULAR | Status: DC | PRN

## 2020-12-07 MED ORDER — SODIUM CHLORIDE 0.9 % IV SOLN: INTRAVENOUS | Status: DC | PRN

## 2020-12-07 MED ORDER — SODIUM CHLORIDE 0.9 % IV SOLN: Freq: Once | INTRAVENOUS | Status: AC

## 2020-12-07 MED ORDER — FAMOTIDINE IN NACL 20-0.9 MG/50ML-% IV SOLN: 20.0000 mg | Freq: Once | INTRAVENOUS | Status: DC | PRN

## 2020-12-07 NOTE — Progress Notes (Signed)
  Diagnosis: COVID-19  Physician: Dr. Wright  Procedure: [facility] Med: bamlanivimab\etesevimab infusion - Provided patient with bamlanimivab\etesevimab fact sheet for patients, parents and caregivers prior to infusion.  Complications: No immediate complications noted.  Discharge: Discharged home   Kimberly Huynh 12/07/2020  

## 2020-12-07 NOTE — Progress Notes (Signed)
Patient reviewed Fact Sheet for Patients, Parents, and Caregivers for Emergency Use Authorization (EUA) of bamlanivimab and etesevimab for the Treatment of Coronavirus. Patient also reviewed and is agreeable to the estimated cost of treatment. Patient is agreeable to proceed.   

## 2020-12-07 NOTE — Discharge Instructions (Signed)
10 Things You Can Do to Manage Your COVID-19 Symptoms at Home If you have possible or confirmed COVID-19: 1. Stay home from work and school. And stay away from other public places. If you must go out, avoid using any kind of public transportation, ridesharing, or taxis. 2. Monitor your symptoms carefully. If your symptoms get worse, call your healthcare provider immediately. 3. Get rest and stay hydrated. 4. If you have a medical appointment, call the healthcare provider ahead of time and tell them that you have or may have COVID-19. 5. For medical emergencies, call 911 and notify the dispatch personnel that you have or may have COVID-19. 6. Cover your cough and sneezes with a tissue or use the inside of your elbow. 7. Wash your hands often with soap and water for at least 20 seconds or clean your hands with an alcohol-based hand sanitizer that contains at least 60% alcohol. 8. As much as possible, stay in a specific room and away from other people in your home. Also, you should use a separate bathroom, if available. If you need to be around other people in or outside of the home, wear a mask. 9. Avoid sharing personal items with other people in your household, like dishes, towels, and bedding. 10. Clean all surfaces that are touched often, like counters, tabletops, and doorknobs. Use household cleaning sprays or wipes according to the label instructions. cdc.gov/coronavirus 06/17/2019 This information is not intended to replace advice given to you by your health care provider. Make sure you discuss any questions you have with your health care provider. Document Revised: 11/19/2019 Document Reviewed: 11/19/2019 Elsevier Patient Education  2020 [employer] What types of side effects do monoclonal antibody drugs cause?  Common side effects  In general, the more common side effects caused by monoclonal antibody drugs include: . Allergic reactions, such as hives or itching . Flu-like signs and  symptoms, including chills, fatigue, fever, and muscle aches and pains . Nausea, vomiting . Diarrhea . Skin rashes . Low blood pressure   The CDC is recommending patients who receive monoclonal antibody treatments wait at least 90 days before being vaccinated.  Currently, there are no data on the safety and efficacy of mRNA COVID-19 vaccines in persons who received monoclonal antibodies or convalescent plasma as part of COVID-19 treatment. Based on the estimated half-life of such therapies as well as evidence suggesting that reinfection is uncommon in the 90 days after initial infection, vaccination should be deferred for at least 90 days, as a precautionary measure until additional information becomes available, to avoid interference of the antibody treatment with vaccine-induced immune responses. If you have any questions or concerns after the infusion please call the Advanced Practice Provider on call at [phone]. This number is ONLY intended for your use regarding questions or concerns about the infusion post-treatment side-effects.  Please do not provide this number to others for use. For return to work notes please contact your primary care provider.   If someone you know is interested in receiving treatment please have them call the COVID hotline at [phone].   

## 2021-02-07 DIAGNOSIS — I5030 Unspecified diastolic (congestive) heart failure: Secondary | ICD-10-CM | POA: Diagnosis not present

## 2021-02-07 DIAGNOSIS — D126 Benign neoplasm of colon, unspecified: Secondary | ICD-10-CM | POA: Diagnosis not present

## 2021-02-07 DIAGNOSIS — K219 Gastro-esophageal reflux disease without esophagitis: Secondary | ICD-10-CM | POA: Diagnosis not present

## 2021-02-07 DIAGNOSIS — F419 Anxiety disorder, unspecified: Secondary | ICD-10-CM | POA: Diagnosis not present

## 2021-02-07 DIAGNOSIS — E119 Type 2 diabetes mellitus without complications: Secondary | ICD-10-CM | POA: Diagnosis not present

## 2021-02-07 DIAGNOSIS — M25562 Pain in left knee: Secondary | ICD-10-CM | POA: Diagnosis not present

## 2021-02-07 DIAGNOSIS — E782 Mixed hyperlipidemia: Secondary | ICD-10-CM | POA: Diagnosis not present

## 2021-02-07 DIAGNOSIS — M810 Age-related osteoporosis without current pathological fracture: Secondary | ICD-10-CM | POA: Diagnosis not present

## 2021-02-07 DIAGNOSIS — U071 COVID-19: Secondary | ICD-10-CM | POA: Diagnosis not present

## 2021-02-07 DIAGNOSIS — H409 Unspecified glaucoma: Secondary | ICD-10-CM | POA: Diagnosis not present

## 2021-02-07 DIAGNOSIS — N1831 Chronic kidney disease, stage 3a: Secondary | ICD-10-CM | POA: Diagnosis not present

## 2021-02-07 DIAGNOSIS — I130 Hypertensive heart and chronic kidney disease with heart failure and stage 1 through stage 4 chronic kidney disease, or unspecified chronic kidney disease: Secondary | ICD-10-CM | POA: Diagnosis not present

## 2021-02-08 DIAGNOSIS — E119 Type 2 diabetes mellitus without complications: Secondary | ICD-10-CM | POA: Diagnosis not present

## 2021-02-08 DIAGNOSIS — I130 Hypertensive heart and chronic kidney disease with heart failure and stage 1 through stage 4 chronic kidney disease, or unspecified chronic kidney disease: Secondary | ICD-10-CM | POA: Diagnosis not present

## 2021-02-08 DIAGNOSIS — N1831 Chronic kidney disease, stage 3a: Secondary | ICD-10-CM | POA: Diagnosis not present

## 2021-02-08 DIAGNOSIS — E782 Mixed hyperlipidemia: Secondary | ICD-10-CM | POA: Diagnosis not present

## 2021-02-27 DIAGNOSIS — H401131 Primary open-angle glaucoma, bilateral, mild stage: Secondary | ICD-10-CM | POA: Diagnosis not present

## 2021-02-28 DIAGNOSIS — L821 Other seborrheic keratosis: Secondary | ICD-10-CM | POA: Diagnosis not present

## 2021-02-28 DIAGNOSIS — L720 Epidermal cyst: Secondary | ICD-10-CM | POA: Diagnosis not present

## 2021-02-28 DIAGNOSIS — D2271 Melanocytic nevi of right lower limb, including hip: Secondary | ICD-10-CM | POA: Diagnosis not present

## 2021-02-28 DIAGNOSIS — L718 Other rosacea: Secondary | ICD-10-CM | POA: Diagnosis not present

## 2021-02-28 DIAGNOSIS — D1801 Hemangioma of skin and subcutaneous tissue: Secondary | ICD-10-CM | POA: Diagnosis not present

## 2021-02-28 DIAGNOSIS — L570 Actinic keratosis: Secondary | ICD-10-CM | POA: Diagnosis not present

## 2021-04-13 DIAGNOSIS — R059 Cough, unspecified: Secondary | ICD-10-CM | POA: Diagnosis not present

## 2021-04-13 DIAGNOSIS — Z20822 Contact with and (suspected) exposure to covid-19: Secondary | ICD-10-CM | POA: Diagnosis not present

## 2021-04-13 DIAGNOSIS — J069 Acute upper respiratory infection, unspecified: Secondary | ICD-10-CM | POA: Diagnosis not present

## 2021-04-13 DIAGNOSIS — R509 Fever, unspecified: Secondary | ICD-10-CM | POA: Diagnosis not present

## 2021-04-28 DIAGNOSIS — H401131 Primary open-angle glaucoma, bilateral, mild stage: Secondary | ICD-10-CM | POA: Diagnosis not present

## 2021-05-23 DIAGNOSIS — J300 Vasomotor rhinitis: Secondary | ICD-10-CM | POA: Diagnosis not present

## 2021-05-23 DIAGNOSIS — R059 Cough, unspecified: Secondary | ICD-10-CM | POA: Diagnosis not present

## 2021-05-26 DIAGNOSIS — E782 Mixed hyperlipidemia: Secondary | ICD-10-CM | POA: Diagnosis not present

## 2021-05-29 DIAGNOSIS — I130 Hypertensive heart and chronic kidney disease with heart failure and stage 1 through stage 4 chronic kidney disease, or unspecified chronic kidney disease: Secondary | ICD-10-CM | POA: Diagnosis not present

## 2021-05-29 DIAGNOSIS — F419 Anxiety disorder, unspecified: Secondary | ICD-10-CM | POA: Diagnosis not present

## 2021-05-29 DIAGNOSIS — K219 Gastro-esophageal reflux disease without esophagitis: Secondary | ICD-10-CM | POA: Diagnosis not present

## 2021-05-29 DIAGNOSIS — N1831 Chronic kidney disease, stage 3a: Secondary | ICD-10-CM | POA: Diagnosis not present

## 2021-05-29 DIAGNOSIS — M810 Age-related osteoporosis without current pathological fracture: Secondary | ICD-10-CM | POA: Diagnosis not present

## 2021-05-29 DIAGNOSIS — E119 Type 2 diabetes mellitus without complications: Secondary | ICD-10-CM | POA: Diagnosis not present

## 2021-05-29 DIAGNOSIS — D126 Benign neoplasm of colon, unspecified: Secondary | ICD-10-CM | POA: Diagnosis not present

## 2021-05-29 DIAGNOSIS — E782 Mixed hyperlipidemia: Secondary | ICD-10-CM | POA: Diagnosis not present

## 2021-05-29 DIAGNOSIS — M25561 Pain in right knee: Secondary | ICD-10-CM | POA: Diagnosis not present

## 2021-05-29 DIAGNOSIS — M25562 Pain in left knee: Secondary | ICD-10-CM | POA: Diagnosis not present

## 2021-05-29 DIAGNOSIS — I5030 Unspecified diastolic (congestive) heart failure: Secondary | ICD-10-CM | POA: Diagnosis not present

## 2021-06-05 DIAGNOSIS — H401131 Primary open-angle glaucoma, bilateral, mild stage: Secondary | ICD-10-CM | POA: Diagnosis not present

## 2021-07-05 DIAGNOSIS — Z1231 Encounter for screening mammogram for malignant neoplasm of breast: Secondary | ICD-10-CM | POA: Diagnosis not present

## 2021-09-23 DIAGNOSIS — Z23 Encounter for immunization: Secondary | ICD-10-CM | POA: Diagnosis not present

## 2021-09-29 DIAGNOSIS — L723 Sebaceous cyst: Secondary | ICD-10-CM | POA: Diagnosis not present

## 2021-10-02 DIAGNOSIS — H5213 Myopia, bilateral: Secondary | ICD-10-CM | POA: Diagnosis not present

## 2021-10-02 DIAGNOSIS — H401131 Primary open-angle glaucoma, bilateral, mild stage: Secondary | ICD-10-CM | POA: Diagnosis not present

## 2021-10-02 DIAGNOSIS — H2513 Age-related nuclear cataract, bilateral: Secondary | ICD-10-CM | POA: Diagnosis not present

## 2021-10-02 DIAGNOSIS — E119 Type 2 diabetes mellitus without complications: Secondary | ICD-10-CM | POA: Diagnosis not present

## 2021-10-18 DIAGNOSIS — E1151 Type 2 diabetes mellitus with diabetic peripheral angiopathy without gangrene: Secondary | ICD-10-CM | POA: Diagnosis not present

## 2021-10-18 DIAGNOSIS — E559 Vitamin D deficiency, unspecified: Secondary | ICD-10-CM | POA: Diagnosis not present

## 2021-10-18 DIAGNOSIS — E782 Mixed hyperlipidemia: Secondary | ICD-10-CM | POA: Diagnosis not present

## 2021-10-26 DIAGNOSIS — E782 Mixed hyperlipidemia: Secondary | ICD-10-CM | POA: Diagnosis not present

## 2021-10-26 DIAGNOSIS — I130 Hypertensive heart and chronic kidney disease with heart failure and stage 1 through stage 4 chronic kidney disease, or unspecified chronic kidney disease: Secondary | ICD-10-CM | POA: Diagnosis not present

## 2021-10-26 DIAGNOSIS — E119 Type 2 diabetes mellitus without complications: Secondary | ICD-10-CM | POA: Diagnosis not present

## 2021-10-26 DIAGNOSIS — R82998 Other abnormal findings in urine: Secondary | ICD-10-CM | POA: Diagnosis not present

## 2021-10-26 DIAGNOSIS — I5030 Unspecified diastolic (congestive) heart failure: Secondary | ICD-10-CM | POA: Diagnosis not present

## 2021-10-26 DIAGNOSIS — Z0000 Encounter for general adult medical examination without abnormal findings: Secondary | ICD-10-CM | POA: Diagnosis not present

## 2021-10-26 DIAGNOSIS — F419 Anxiety disorder, unspecified: Secondary | ICD-10-CM | POA: Diagnosis not present

## 2021-10-26 DIAGNOSIS — M810 Age-related osteoporosis without current pathological fracture: Secondary | ICD-10-CM | POA: Diagnosis not present

## 2021-10-26 DIAGNOSIS — Z1212 Encounter for screening for malignant neoplasm of rectum: Secondary | ICD-10-CM | POA: Diagnosis not present

## 2021-10-26 DIAGNOSIS — K219 Gastro-esophageal reflux disease without esophagitis: Secondary | ICD-10-CM | POA: Diagnosis not present

## 2021-10-26 DIAGNOSIS — I10 Essential (primary) hypertension: Secondary | ICD-10-CM | POA: Diagnosis not present

## 2021-10-26 DIAGNOSIS — N1831 Chronic kidney disease, stage 3a: Secondary | ICD-10-CM | POA: Diagnosis not present

## 2021-10-26 DIAGNOSIS — H409 Unspecified glaucoma: Secondary | ICD-10-CM | POA: Diagnosis not present

## 2021-10-26 DIAGNOSIS — Z7184 Encounter for health counseling related to travel: Secondary | ICD-10-CM | POA: Diagnosis not present

## 2021-10-26 DIAGNOSIS — D126 Benign neoplasm of colon, unspecified: Secondary | ICD-10-CM | POA: Diagnosis not present

## 2021-10-30 DIAGNOSIS — Z124 Encounter for screening for malignant neoplasm of cervix: Secondary | ICD-10-CM | POA: Diagnosis not present

## 2021-10-30 DIAGNOSIS — Z01411 Encounter for gynecological examination (general) (routine) with abnormal findings: Secondary | ICD-10-CM | POA: Diagnosis not present

## 2021-10-30 DIAGNOSIS — Z01419 Encounter for gynecological examination (general) (routine) without abnormal findings: Secondary | ICD-10-CM | POA: Diagnosis not present

## 2021-10-30 DIAGNOSIS — Z780 Asymptomatic menopausal state: Secondary | ICD-10-CM | POA: Diagnosis not present

## 2021-10-30 DIAGNOSIS — Z6827 Body mass index (BMI) 27.0-27.9, adult: Secondary | ICD-10-CM | POA: Diagnosis not present

## 2021-12-04 DIAGNOSIS — H401131 Primary open-angle glaucoma, bilateral, mild stage: Secondary | ICD-10-CM | POA: Diagnosis not present

## 2022-02-09 DIAGNOSIS — H401131 Primary open-angle glaucoma, bilateral, mild stage: Secondary | ICD-10-CM | POA: Diagnosis not present

## 2022-03-05 DIAGNOSIS — H401131 Primary open-angle glaucoma, bilateral, mild stage: Secondary | ICD-10-CM | POA: Diagnosis not present

## 2022-03-06 DIAGNOSIS — M810 Age-related osteoporosis without current pathological fracture: Secondary | ICD-10-CM | POA: Diagnosis not present

## 2022-03-06 DIAGNOSIS — K219 Gastro-esophageal reflux disease without esophagitis: Secondary | ICD-10-CM | POA: Diagnosis not present

## 2022-03-06 DIAGNOSIS — N1831 Chronic kidney disease, stage 3a: Secondary | ICD-10-CM | POA: Diagnosis not present

## 2022-03-06 DIAGNOSIS — E782 Mixed hyperlipidemia: Secondary | ICD-10-CM | POA: Diagnosis not present

## 2022-03-06 DIAGNOSIS — F419 Anxiety disorder, unspecified: Secondary | ICD-10-CM | POA: Diagnosis not present

## 2022-03-06 DIAGNOSIS — I130 Hypertensive heart and chronic kidney disease with heart failure and stage 1 through stage 4 chronic kidney disease, or unspecified chronic kidney disease: Secondary | ICD-10-CM | POA: Diagnosis not present

## 2022-03-06 DIAGNOSIS — I5030 Unspecified diastolic (congestive) heart failure: Secondary | ICD-10-CM | POA: Diagnosis not present

## 2022-03-06 DIAGNOSIS — D126 Benign neoplasm of colon, unspecified: Secondary | ICD-10-CM | POA: Diagnosis not present

## 2022-03-06 DIAGNOSIS — E119 Type 2 diabetes mellitus without complications: Secondary | ICD-10-CM | POA: Diagnosis not present

## 2022-04-03 DIAGNOSIS — L570 Actinic keratosis: Secondary | ICD-10-CM | POA: Diagnosis not present

## 2022-04-03 DIAGNOSIS — L814 Other melanin hyperpigmentation: Secondary | ICD-10-CM | POA: Diagnosis not present

## 2022-04-03 DIAGNOSIS — L738 Other specified follicular disorders: Secondary | ICD-10-CM | POA: Diagnosis not present

## 2022-04-03 DIAGNOSIS — L821 Other seborrheic keratosis: Secondary | ICD-10-CM | POA: Diagnosis not present

## 2022-05-17 DIAGNOSIS — M898X1 Other specified disorders of bone, shoulder: Secondary | ICD-10-CM | POA: Diagnosis not present

## 2022-05-22 DIAGNOSIS — M25512 Pain in left shoulder: Secondary | ICD-10-CM | POA: Diagnosis not present

## 2022-05-22 DIAGNOSIS — M545 Low back pain, unspecified: Secondary | ICD-10-CM | POA: Diagnosis not present

## 2022-05-23 DIAGNOSIS — R059 Cough, unspecified: Secondary | ICD-10-CM | POA: Diagnosis not present

## 2022-05-23 DIAGNOSIS — J300 Vasomotor rhinitis: Secondary | ICD-10-CM | POA: Diagnosis not present

## 2022-06-01 DIAGNOSIS — M25512 Pain in left shoulder: Secondary | ICD-10-CM | POA: Diagnosis not present

## 2022-06-01 DIAGNOSIS — M545 Low back pain, unspecified: Secondary | ICD-10-CM | POA: Diagnosis not present

## 2022-06-06 DIAGNOSIS — M545 Low back pain, unspecified: Secondary | ICD-10-CM | POA: Diagnosis not present

## 2022-06-06 DIAGNOSIS — M25512 Pain in left shoulder: Secondary | ICD-10-CM | POA: Diagnosis not present

## 2022-06-07 DIAGNOSIS — H0015 Chalazion left lower eyelid: Secondary | ICD-10-CM | POA: Diagnosis not present

## 2022-06-13 DIAGNOSIS — M545 Low back pain, unspecified: Secondary | ICD-10-CM | POA: Diagnosis not present

## 2022-06-13 DIAGNOSIS — M25512 Pain in left shoulder: Secondary | ICD-10-CM | POA: Diagnosis not present

## 2022-06-22 DIAGNOSIS — M25512 Pain in left shoulder: Secondary | ICD-10-CM | POA: Diagnosis not present

## 2022-06-22 DIAGNOSIS — M545 Low back pain, unspecified: Secondary | ICD-10-CM | POA: Diagnosis not present

## 2022-06-26 DIAGNOSIS — M25512 Pain in left shoulder: Secondary | ICD-10-CM | POA: Diagnosis not present

## 2022-06-29 DIAGNOSIS — M25512 Pain in left shoulder: Secondary | ICD-10-CM | POA: Diagnosis not present

## 2022-06-29 DIAGNOSIS — M545 Low back pain, unspecified: Secondary | ICD-10-CM | POA: Diagnosis not present

## 2022-07-05 DIAGNOSIS — M545 Low back pain, unspecified: Secondary | ICD-10-CM | POA: Diagnosis not present

## 2022-07-05 DIAGNOSIS — M25512 Pain in left shoulder: Secondary | ICD-10-CM | POA: Diagnosis not present

## 2022-07-11 DIAGNOSIS — H401131 Primary open-angle glaucoma, bilateral, mild stage: Secondary | ICD-10-CM | POA: Diagnosis not present

## 2022-07-13 DIAGNOSIS — M25512 Pain in left shoulder: Secondary | ICD-10-CM | POA: Diagnosis not present

## 2022-07-13 DIAGNOSIS — M545 Low back pain, unspecified: Secondary | ICD-10-CM | POA: Diagnosis not present

## 2022-07-17 DIAGNOSIS — N1831 Chronic kidney disease, stage 3a: Secondary | ICD-10-CM | POA: Diagnosis not present

## 2022-07-17 DIAGNOSIS — E782 Mixed hyperlipidemia: Secondary | ICD-10-CM | POA: Diagnosis not present

## 2022-07-17 DIAGNOSIS — E559 Vitamin D deficiency, unspecified: Secondary | ICD-10-CM | POA: Diagnosis not present

## 2022-07-18 DIAGNOSIS — E119 Type 2 diabetes mellitus without complications: Secondary | ICD-10-CM | POA: Diagnosis not present

## 2022-07-18 DIAGNOSIS — I5030 Unspecified diastolic (congestive) heart failure: Secondary | ICD-10-CM | POA: Diagnosis not present

## 2022-07-18 DIAGNOSIS — M898X1 Other specified disorders of bone, shoulder: Secondary | ICD-10-CM | POA: Diagnosis not present

## 2022-07-18 DIAGNOSIS — I130 Hypertensive heart and chronic kidney disease with heart failure and stage 1 through stage 4 chronic kidney disease, or unspecified chronic kidney disease: Secondary | ICD-10-CM | POA: Diagnosis not present

## 2022-07-18 DIAGNOSIS — F419 Anxiety disorder, unspecified: Secondary | ICD-10-CM | POA: Diagnosis not present

## 2022-07-18 DIAGNOSIS — M810 Age-related osteoporosis without current pathological fracture: Secondary | ICD-10-CM | POA: Diagnosis not present

## 2022-07-18 DIAGNOSIS — H409 Unspecified glaucoma: Secondary | ICD-10-CM | POA: Diagnosis not present

## 2022-07-18 DIAGNOSIS — D126 Benign neoplasm of colon, unspecified: Secondary | ICD-10-CM | POA: Diagnosis not present

## 2022-07-18 DIAGNOSIS — N1831 Chronic kidney disease, stage 3a: Secondary | ICD-10-CM | POA: Diagnosis not present

## 2022-07-18 DIAGNOSIS — E782 Mixed hyperlipidemia: Secondary | ICD-10-CM | POA: Diagnosis not present

## 2022-07-20 DIAGNOSIS — M545 Low back pain, unspecified: Secondary | ICD-10-CM | POA: Diagnosis not present

## 2022-07-20 DIAGNOSIS — M25512 Pain in left shoulder: Secondary | ICD-10-CM | POA: Diagnosis not present

## 2022-07-24 DIAGNOSIS — M25512 Pain in left shoulder: Secondary | ICD-10-CM | POA: Diagnosis not present

## 2022-07-24 DIAGNOSIS — M545 Low back pain, unspecified: Secondary | ICD-10-CM | POA: Diagnosis not present

## 2022-07-25 DIAGNOSIS — Z1231 Encounter for screening mammogram for malignant neoplasm of breast: Secondary | ICD-10-CM | POA: Diagnosis not present

## 2022-08-06 DIAGNOSIS — M25512 Pain in left shoulder: Secondary | ICD-10-CM | POA: Diagnosis not present

## 2022-08-06 DIAGNOSIS — M545 Low back pain, unspecified: Secondary | ICD-10-CM | POA: Diagnosis not present

## 2022-08-14 DIAGNOSIS — M545 Low back pain, unspecified: Secondary | ICD-10-CM | POA: Diagnosis not present

## 2022-08-14 DIAGNOSIS — M25512 Pain in left shoulder: Secondary | ICD-10-CM | POA: Diagnosis not present

## 2022-08-22 DIAGNOSIS — M25512 Pain in left shoulder: Secondary | ICD-10-CM | POA: Diagnosis not present

## 2022-08-22 DIAGNOSIS — M545 Low back pain, unspecified: Secondary | ICD-10-CM | POA: Diagnosis not present

## 2022-08-29 DIAGNOSIS — M25512 Pain in left shoulder: Secondary | ICD-10-CM | POA: Diagnosis not present

## 2022-08-29 DIAGNOSIS — M545 Low back pain, unspecified: Secondary | ICD-10-CM | POA: Diagnosis not present

## 2022-09-05 DIAGNOSIS — R5383 Other fatigue: Secondary | ICD-10-CM | POA: Diagnosis not present

## 2022-09-05 DIAGNOSIS — J029 Acute pharyngitis, unspecified: Secondary | ICD-10-CM | POA: Diagnosis not present

## 2022-09-05 DIAGNOSIS — Z1152 Encounter for screening for COVID-19: Secondary | ICD-10-CM | POA: Diagnosis not present

## 2022-09-05 DIAGNOSIS — J069 Acute upper respiratory infection, unspecified: Secondary | ICD-10-CM | POA: Diagnosis not present

## 2022-09-05 DIAGNOSIS — R0981 Nasal congestion: Secondary | ICD-10-CM | POA: Diagnosis not present

## 2022-09-14 DIAGNOSIS — M545 Low back pain, unspecified: Secondary | ICD-10-CM | POA: Diagnosis not present

## 2022-09-14 DIAGNOSIS — M25512 Pain in left shoulder: Secondary | ICD-10-CM | POA: Diagnosis not present

## 2022-09-25 DIAGNOSIS — M25512 Pain in left shoulder: Secondary | ICD-10-CM | POA: Diagnosis not present

## 2022-09-25 DIAGNOSIS — M545 Low back pain, unspecified: Secondary | ICD-10-CM | POA: Diagnosis not present

## 2022-10-01 DIAGNOSIS — H401131 Primary open-angle glaucoma, bilateral, mild stage: Secondary | ICD-10-CM | POA: Diagnosis not present

## 2022-10-04 DIAGNOSIS — H5213 Myopia, bilateral: Secondary | ICD-10-CM | POA: Diagnosis not present

## 2022-10-04 DIAGNOSIS — H2513 Age-related nuclear cataract, bilateral: Secondary | ICD-10-CM | POA: Diagnosis not present

## 2022-10-04 DIAGNOSIS — E119 Type 2 diabetes mellitus without complications: Secondary | ICD-10-CM | POA: Diagnosis not present

## 2022-10-04 DIAGNOSIS — H401131 Primary open-angle glaucoma, bilateral, mild stage: Secondary | ICD-10-CM | POA: Diagnosis not present

## 2022-10-13 DIAGNOSIS — Z23 Encounter for immunization: Secondary | ICD-10-CM | POA: Diagnosis not present

## 2022-10-19 DIAGNOSIS — M25512 Pain in left shoulder: Secondary | ICD-10-CM | POA: Diagnosis not present

## 2022-10-26 DIAGNOSIS — E119 Type 2 diabetes mellitus without complications: Secondary | ICD-10-CM | POA: Diagnosis not present

## 2022-10-26 DIAGNOSIS — E559 Vitamin D deficiency, unspecified: Secondary | ICD-10-CM | POA: Diagnosis not present

## 2022-10-26 DIAGNOSIS — R5383 Other fatigue: Secondary | ICD-10-CM | POA: Diagnosis not present

## 2022-10-26 DIAGNOSIS — R7989 Other specified abnormal findings of blood chemistry: Secondary | ICD-10-CM | POA: Diagnosis not present

## 2022-10-26 DIAGNOSIS — F419 Anxiety disorder, unspecified: Secondary | ICD-10-CM | POA: Diagnosis not present

## 2022-10-26 DIAGNOSIS — E782 Mixed hyperlipidemia: Secondary | ICD-10-CM | POA: Diagnosis not present

## 2022-11-02 DIAGNOSIS — I5030 Unspecified diastolic (congestive) heart failure: Secondary | ICD-10-CM | POA: Diagnosis not present

## 2022-11-02 DIAGNOSIS — N1831 Chronic kidney disease, stage 3a: Secondary | ICD-10-CM | POA: Diagnosis not present

## 2022-11-02 DIAGNOSIS — M810 Age-related osteoporosis without current pathological fracture: Secondary | ICD-10-CM | POA: Diagnosis not present

## 2022-11-02 DIAGNOSIS — H409 Unspecified glaucoma: Secondary | ICD-10-CM | POA: Diagnosis not present

## 2022-11-02 DIAGNOSIS — M549 Dorsalgia, unspecified: Secondary | ICD-10-CM | POA: Diagnosis not present

## 2022-11-02 DIAGNOSIS — Z0000 Encounter for general adult medical examination without abnormal findings: Secondary | ICD-10-CM | POA: Diagnosis not present

## 2022-11-02 DIAGNOSIS — K219 Gastro-esophageal reflux disease without esophagitis: Secondary | ICD-10-CM | POA: Diagnosis not present

## 2022-11-02 DIAGNOSIS — E782 Mixed hyperlipidemia: Secondary | ICD-10-CM | POA: Diagnosis not present

## 2022-11-02 DIAGNOSIS — I130 Hypertensive heart and chronic kidney disease with heart failure and stage 1 through stage 4 chronic kidney disease, or unspecified chronic kidney disease: Secondary | ICD-10-CM | POA: Diagnosis not present

## 2022-11-02 DIAGNOSIS — F419 Anxiety disorder, unspecified: Secondary | ICD-10-CM | POA: Diagnosis not present

## 2022-11-02 DIAGNOSIS — M898X1 Other specified disorders of bone, shoulder: Secondary | ICD-10-CM | POA: Diagnosis not present

## 2022-11-02 DIAGNOSIS — E119 Type 2 diabetes mellitus without complications: Secondary | ICD-10-CM | POA: Diagnosis not present

## 2022-11-27 DIAGNOSIS — Z780 Asymptomatic menopausal state: Secondary | ICD-10-CM | POA: Diagnosis not present

## 2022-11-27 DIAGNOSIS — Z01411 Encounter for gynecological examination (general) (routine) with abnormal findings: Secondary | ICD-10-CM | POA: Diagnosis not present

## 2022-11-27 DIAGNOSIS — Z01419 Encounter for gynecological examination (general) (routine) without abnormal findings: Secondary | ICD-10-CM | POA: Diagnosis not present

## 2022-11-27 DIAGNOSIS — Z124 Encounter for screening for malignant neoplasm of cervix: Secondary | ICD-10-CM | POA: Diagnosis not present

## 2022-11-27 DIAGNOSIS — Z6826 Body mass index (BMI) 26.0-26.9, adult: Secondary | ICD-10-CM | POA: Diagnosis not present

## 2022-12-31 DIAGNOSIS — M25512 Pain in left shoulder: Secondary | ICD-10-CM | POA: Diagnosis not present

## 2023-01-04 DIAGNOSIS — M25512 Pain in left shoulder: Secondary | ICD-10-CM | POA: Diagnosis not present

## 2023-01-11 DIAGNOSIS — M25512 Pain in left shoulder: Secondary | ICD-10-CM | POA: Diagnosis not present

## 2023-01-18 DIAGNOSIS — M25512 Pain in left shoulder: Secondary | ICD-10-CM | POA: Diagnosis not present

## 2023-01-25 DIAGNOSIS — M25512 Pain in left shoulder: Secondary | ICD-10-CM | POA: Diagnosis not present

## 2023-02-04 DIAGNOSIS — H401131 Primary open-angle glaucoma, bilateral, mild stage: Secondary | ICD-10-CM | POA: Diagnosis not present

## 2023-03-01 DIAGNOSIS — M25512 Pain in left shoulder: Secondary | ICD-10-CM | POA: Diagnosis not present

## 2023-03-04 DIAGNOSIS — F419 Anxiety disorder, unspecified: Secondary | ICD-10-CM | POA: Diagnosis not present

## 2023-03-04 DIAGNOSIS — E782 Mixed hyperlipidemia: Secondary | ICD-10-CM | POA: Diagnosis not present

## 2023-03-04 DIAGNOSIS — M898X1 Other specified disorders of bone, shoulder: Secondary | ICD-10-CM | POA: Diagnosis not present

## 2023-03-04 DIAGNOSIS — K219 Gastro-esophageal reflux disease without esophagitis: Secondary | ICD-10-CM | POA: Diagnosis not present

## 2023-03-04 DIAGNOSIS — I5030 Unspecified diastolic (congestive) heart failure: Secondary | ICD-10-CM | POA: Diagnosis not present

## 2023-03-04 DIAGNOSIS — M549 Dorsalgia, unspecified: Secondary | ICD-10-CM | POA: Diagnosis not present

## 2023-03-04 DIAGNOSIS — E119 Type 2 diabetes mellitus without complications: Secondary | ICD-10-CM | POA: Diagnosis not present

## 2023-03-04 DIAGNOSIS — N1831 Chronic kidney disease, stage 3a: Secondary | ICD-10-CM | POA: Diagnosis not present

## 2023-03-04 DIAGNOSIS — H409 Unspecified glaucoma: Secondary | ICD-10-CM | POA: Diagnosis not present

## 2023-03-04 DIAGNOSIS — I130 Hypertensive heart and chronic kidney disease with heart failure and stage 1 through stage 4 chronic kidney disease, or unspecified chronic kidney disease: Secondary | ICD-10-CM | POA: Diagnosis not present

## 2023-03-04 DIAGNOSIS — D126 Benign neoplasm of colon, unspecified: Secondary | ICD-10-CM | POA: Diagnosis not present

## 2023-03-04 DIAGNOSIS — M810 Age-related osteoporosis without current pathological fracture: Secondary | ICD-10-CM | POA: Diagnosis not present

## 2023-05-24 DIAGNOSIS — R052 Subacute cough: Secondary | ICD-10-CM | POA: Diagnosis not present

## 2023-05-24 DIAGNOSIS — J300 Vasomotor rhinitis: Secondary | ICD-10-CM | POA: Diagnosis not present

## 2023-05-28 DIAGNOSIS — D1801 Hemangioma of skin and subcutaneous tissue: Secondary | ICD-10-CM | POA: Diagnosis not present

## 2023-05-28 DIAGNOSIS — L821 Other seborrheic keratosis: Secondary | ICD-10-CM | POA: Diagnosis not present

## 2023-05-28 DIAGNOSIS — L720 Epidermal cyst: Secondary | ICD-10-CM | POA: Diagnosis not present

## 2023-05-28 DIAGNOSIS — L570 Actinic keratosis: Secondary | ICD-10-CM | POA: Diagnosis not present

## 2023-05-28 DIAGNOSIS — D2271 Melanocytic nevi of right lower limb, including hip: Secondary | ICD-10-CM | POA: Diagnosis not present

## 2023-06-03 DIAGNOSIS — F419 Anxiety disorder, unspecified: Secondary | ICD-10-CM | POA: Diagnosis not present

## 2023-06-03 DIAGNOSIS — I5030 Unspecified diastolic (congestive) heart failure: Secondary | ICD-10-CM | POA: Diagnosis not present

## 2023-06-03 DIAGNOSIS — D126 Benign neoplasm of colon, unspecified: Secondary | ICD-10-CM | POA: Diagnosis not present

## 2023-06-03 DIAGNOSIS — K219 Gastro-esophageal reflux disease without esophagitis: Secondary | ICD-10-CM | POA: Diagnosis not present

## 2023-06-03 DIAGNOSIS — E782 Mixed hyperlipidemia: Secondary | ICD-10-CM | POA: Diagnosis not present

## 2023-06-03 DIAGNOSIS — N1831 Chronic kidney disease, stage 3a: Secondary | ICD-10-CM | POA: Diagnosis not present

## 2023-06-03 DIAGNOSIS — M810 Age-related osteoporosis without current pathological fracture: Secondary | ICD-10-CM | POA: Diagnosis not present

## 2023-06-03 DIAGNOSIS — M549 Dorsalgia, unspecified: Secondary | ICD-10-CM | POA: Diagnosis not present

## 2023-06-03 DIAGNOSIS — I130 Hypertensive heart and chronic kidney disease with heart failure and stage 1 through stage 4 chronic kidney disease, or unspecified chronic kidney disease: Secondary | ICD-10-CM | POA: Diagnosis not present

## 2023-06-03 DIAGNOSIS — H409 Unspecified glaucoma: Secondary | ICD-10-CM | POA: Diagnosis not present

## 2023-06-03 DIAGNOSIS — E119 Type 2 diabetes mellitus without complications: Secondary | ICD-10-CM | POA: Diagnosis not present

## 2023-06-24 DIAGNOSIS — H401131 Primary open-angle glaucoma, bilateral, mild stage: Secondary | ICD-10-CM | POA: Diagnosis not present

## 2023-07-03 DIAGNOSIS — E119 Type 2 diabetes mellitus without complications: Secondary | ICD-10-CM | POA: Diagnosis not present

## 2023-07-03 DIAGNOSIS — I130 Hypertensive heart and chronic kidney disease with heart failure and stage 1 through stage 4 chronic kidney disease, or unspecified chronic kidney disease: Secondary | ICD-10-CM | POA: Diagnosis not present

## 2023-07-03 DIAGNOSIS — E782 Mixed hyperlipidemia: Secondary | ICD-10-CM | POA: Diagnosis not present

## 2023-07-29 DIAGNOSIS — Z1231 Encounter for screening mammogram for malignant neoplasm of breast: Secondary | ICD-10-CM | POA: Diagnosis not present

## 2023-10-10 DIAGNOSIS — E119 Type 2 diabetes mellitus without complications: Secondary | ICD-10-CM | POA: Diagnosis not present

## 2023-10-10 DIAGNOSIS — H401131 Primary open-angle glaucoma, bilateral, mild stage: Secondary | ICD-10-CM | POA: Diagnosis not present

## 2023-10-10 DIAGNOSIS — H2513 Age-related nuclear cataract, bilateral: Secondary | ICD-10-CM | POA: Diagnosis not present

## 2023-10-10 DIAGNOSIS — H5213 Myopia, bilateral: Secondary | ICD-10-CM | POA: Diagnosis not present

## 2023-10-12 DIAGNOSIS — Z23 Encounter for immunization: Secondary | ICD-10-CM | POA: Diagnosis not present

## 2023-10-21 DIAGNOSIS — J302 Other seasonal allergic rhinitis: Secondary | ICD-10-CM | POA: Diagnosis not present

## 2023-10-21 DIAGNOSIS — I130 Hypertensive heart and chronic kidney disease with heart failure and stage 1 through stage 4 chronic kidney disease, or unspecified chronic kidney disease: Secondary | ICD-10-CM | POA: Diagnosis not present

## 2023-10-21 DIAGNOSIS — H8112 Benign paroxysmal vertigo, left ear: Secondary | ICD-10-CM | POA: Diagnosis not present

## 2023-10-31 DIAGNOSIS — H0014 Chalazion left upper eyelid: Secondary | ICD-10-CM | POA: Diagnosis not present

## 2023-11-04 DIAGNOSIS — E785 Hyperlipidemia, unspecified: Secondary | ICD-10-CM | POA: Diagnosis not present

## 2023-11-04 DIAGNOSIS — Z794 Long term (current) use of insulin: Secondary | ICD-10-CM | POA: Diagnosis not present

## 2023-11-04 DIAGNOSIS — E119 Type 2 diabetes mellitus without complications: Secondary | ICD-10-CM | POA: Diagnosis not present

## 2023-11-04 DIAGNOSIS — I130 Hypertensive heart and chronic kidney disease with heart failure and stage 1 through stage 4 chronic kidney disease, or unspecified chronic kidney disease: Secondary | ICD-10-CM | POA: Diagnosis not present

## 2023-11-04 DIAGNOSIS — Z79899 Other long term (current) drug therapy: Secondary | ICD-10-CM | POA: Diagnosis not present

## 2023-11-04 DIAGNOSIS — N1831 Chronic kidney disease, stage 3a: Secondary | ICD-10-CM | POA: Diagnosis not present

## 2023-11-04 DIAGNOSIS — E782 Mixed hyperlipidemia: Secondary | ICD-10-CM | POA: Diagnosis not present

## 2023-11-04 DIAGNOSIS — R7989 Other specified abnormal findings of blood chemistry: Secondary | ICD-10-CM | POA: Diagnosis not present

## 2023-11-04 DIAGNOSIS — Z1212 Encounter for screening for malignant neoplasm of rectum: Secondary | ICD-10-CM | POA: Diagnosis not present

## 2023-11-04 DIAGNOSIS — I5030 Unspecified diastolic (congestive) heart failure: Secondary | ICD-10-CM | POA: Diagnosis not present

## 2023-11-18 DIAGNOSIS — I130 Hypertensive heart and chronic kidney disease with heart failure and stage 1 through stage 4 chronic kidney disease, or unspecified chronic kidney disease: Secondary | ICD-10-CM | POA: Diagnosis not present

## 2023-11-18 DIAGNOSIS — Z1331 Encounter for screening for depression: Secondary | ICD-10-CM | POA: Diagnosis not present

## 2023-11-18 DIAGNOSIS — N1831 Chronic kidney disease, stage 3a: Secondary | ICD-10-CM | POA: Diagnosis not present

## 2023-11-18 DIAGNOSIS — E119 Type 2 diabetes mellitus without complications: Secondary | ICD-10-CM | POA: Diagnosis not present

## 2023-11-18 DIAGNOSIS — R82998 Other abnormal findings in urine: Secondary | ICD-10-CM | POA: Diagnosis not present

## 2023-11-18 DIAGNOSIS — F419 Anxiety disorder, unspecified: Secondary | ICD-10-CM | POA: Diagnosis not present

## 2023-11-18 DIAGNOSIS — I5030 Unspecified diastolic (congestive) heart failure: Secondary | ICD-10-CM | POA: Diagnosis not present

## 2023-11-18 DIAGNOSIS — E782 Mixed hyperlipidemia: Secondary | ICD-10-CM | POA: Diagnosis not present

## 2023-11-18 DIAGNOSIS — Z0000 Encounter for general adult medical examination without abnormal findings: Secondary | ICD-10-CM | POA: Diagnosis not present

## 2023-11-18 DIAGNOSIS — Z1339 Encounter for screening examination for other mental health and behavioral disorders: Secondary | ICD-10-CM | POA: Diagnosis not present

## 2023-11-18 DIAGNOSIS — Z23 Encounter for immunization: Secondary | ICD-10-CM | POA: Diagnosis not present

## 2023-11-18 DIAGNOSIS — M810 Age-related osteoporosis without current pathological fracture: Secondary | ICD-10-CM | POA: Diagnosis not present

## 2023-11-18 DIAGNOSIS — K219 Gastro-esophageal reflux disease without esophagitis: Secondary | ICD-10-CM | POA: Diagnosis not present

## 2023-11-19 ENCOUNTER — Other Ambulatory Visit: Payer: Self-pay | Admitting: Internal Medicine

## 2023-11-19 DIAGNOSIS — N1831 Chronic kidney disease, stage 3a: Secondary | ICD-10-CM

## 2023-11-22 ENCOUNTER — Ambulatory Visit
Admission: RE | Admit: 2023-11-22 | Discharge: 2023-11-22 | Disposition: A | Discharge status: Home / Self Care | Payer: Medicare Other | Source: Ambulatory Visit | Attending: Internal Medicine | Admitting: Internal Medicine

## 2023-11-22 DIAGNOSIS — N1831 Chronic kidney disease, stage 3a: Secondary | ICD-10-CM

## 2023-11-25 DIAGNOSIS — H401131 Primary open-angle glaucoma, bilateral, mild stage: Secondary | ICD-10-CM | POA: Diagnosis not present

## 2023-12-03 DIAGNOSIS — Z124 Encounter for screening for malignant neoplasm of cervix: Secondary | ICD-10-CM | POA: Diagnosis not present

## 2023-12-03 DIAGNOSIS — Z1331 Encounter for screening for depression: Secondary | ICD-10-CM | POA: Diagnosis not present

## 2023-12-03 DIAGNOSIS — Z01419 Encounter for gynecological examination (general) (routine) without abnormal findings: Secondary | ICD-10-CM | POA: Diagnosis not present

## 2023-12-09 DIAGNOSIS — H0014 Chalazion left upper eyelid: Secondary | ICD-10-CM | POA: Diagnosis not present

## 2024-12-02 ENCOUNTER — Other Ambulatory Visit (HOSPITAL_COMMUNITY): Payer: Self-pay | Admitting: Internal Medicine

## 2024-12-02 DIAGNOSIS — I5030 Unspecified diastolic (congestive) heart failure: Secondary | ICD-10-CM

## 2025-01-06 ENCOUNTER — Ambulatory Visit (HOSPITAL_BASED_OUTPATIENT_CLINIC_OR_DEPARTMENT_OTHER)

## 2025-01-06 DIAGNOSIS — I5030 Unspecified diastolic (congestive) heart failure: Secondary | ICD-10-CM

## 2025-01-06 LAB — ECHOCARDIOGRAM COMPLETE
Area-P 1/2: 2.95 cm2
S' Lateral: 2.66 cm
